# Patient Record
Sex: Female | Born: 1970 | Race: White | Hispanic: No | Marital: Married | Smoking: Current every day smoker
Health system: Southern US, Community
[De-identification: ages and names within clinical notes are randomized; demographics above are authoritative.]

## PROBLEM LIST (undated history)

## (undated) DIAGNOSIS — F32A Depression, unspecified: Secondary | ICD-10-CM

## (undated) DIAGNOSIS — F419 Anxiety disorder, unspecified: Secondary | ICD-10-CM

## (undated) DIAGNOSIS — F329 Major depressive disorder, single episode, unspecified: Secondary | ICD-10-CM

## (undated) HISTORY — PX: NO PAST SURGERIES: SHX2092

---

## 2000-07-24 ENCOUNTER — Encounter: Payer: Self-pay | Admitting: Orthopedic Surgery

## 2000-07-24 ENCOUNTER — Encounter: Admission: RE | Admit: 2000-07-24 | Discharge: 2000-07-24 | Payer: Self-pay | Admitting: Orthopedic Surgery

## 2000-08-14 ENCOUNTER — Encounter: Admission: RE | Admit: 2000-08-14 | Discharge: 2000-08-14 | Payer: Self-pay | Admitting: Orthopedic Surgery

## 2000-08-14 ENCOUNTER — Encounter: Payer: Self-pay | Admitting: Orthopedic Surgery

## 2000-12-12 ENCOUNTER — Encounter: Admission: RE | Admit: 2000-12-12 | Discharge: 2001-03-12 | Payer: Self-pay | Admitting: Anesthesiology

## 2001-04-10 ENCOUNTER — Encounter: Admission: RE | Admit: 2001-04-10 | Discharge: 2001-07-09 | Payer: Self-pay

## 2001-07-03 ENCOUNTER — Other Ambulatory Visit: Admission: RE | Admit: 2001-07-03 | Discharge: 2001-07-03 | Payer: Self-pay | Admitting: *Deleted

## 2002-08-23 ENCOUNTER — Encounter: Admission: RE | Admit: 2002-08-23 | Discharge: 2002-11-21 | Payer: Self-pay

## 2004-07-18 ENCOUNTER — Ambulatory Visit: Payer: Self-pay | Admitting: Cardiovascular Disease

## 2016-06-26 ENCOUNTER — Inpatient Hospital Stay (HOSPITAL_COMMUNITY)
Admission: EM | Admit: 2016-06-26 | Discharge: 2016-06-29 | DRG: 641 | Disposition: A | Discharge status: Home / Self Care | Payer: Medicaid Other | Attending: Internal Medicine | Admitting: Internal Medicine

## 2016-06-26 ENCOUNTER — Encounter: Payer: Self-pay | Admitting: Emergency Medicine

## 2016-06-26 ENCOUNTER — Observation Stay (HOSPITAL_COMMUNITY): Payer: Self-pay

## 2016-06-26 DIAGNOSIS — Z833 Family history of diabetes mellitus: Secondary | ICD-10-CM

## 2016-06-26 DIAGNOSIS — F1010 Alcohol abuse, uncomplicated: Secondary | ICD-10-CM | POA: Diagnosis present

## 2016-06-26 DIAGNOSIS — Z807 Family history of other malignant neoplasms of lymphoid, hematopoietic and related tissues: Secondary | ICD-10-CM

## 2016-06-26 DIAGNOSIS — M545 Low back pain: Secondary | ICD-10-CM | POA: Diagnosis present

## 2016-06-26 DIAGNOSIS — F10229 Alcohol dependence with intoxication, unspecified: Secondary | ICD-10-CM | POA: Diagnosis present

## 2016-06-26 DIAGNOSIS — R9431 Abnormal electrocardiogram [ECG] [EKG]: Secondary | ICD-10-CM | POA: Diagnosis present

## 2016-06-26 DIAGNOSIS — Z79899 Other long term (current) drug therapy: Secondary | ICD-10-CM

## 2016-06-26 DIAGNOSIS — F10939 Alcohol use, unspecified with withdrawal, unspecified: Secondary | ICD-10-CM | POA: Diagnosis present

## 2016-06-26 DIAGNOSIS — Z818 Family history of other mental and behavioral disorders: Secondary | ICD-10-CM

## 2016-06-26 DIAGNOSIS — F10239 Alcohol dependence with withdrawal, unspecified: Secondary | ICD-10-CM | POA: Diagnosis present

## 2016-06-26 DIAGNOSIS — I10 Essential (primary) hypertension: Secondary | ICD-10-CM | POA: Diagnosis present

## 2016-06-26 DIAGNOSIS — Z8249 Family history of ischemic heart disease and other diseases of the circulatory system: Secondary | ICD-10-CM

## 2016-06-26 DIAGNOSIS — D649 Anemia, unspecified: Secondary | ICD-10-CM | POA: Diagnosis present

## 2016-06-26 DIAGNOSIS — Z9049 Acquired absence of other specified parts of digestive tract: Secondary | ICD-10-CM

## 2016-06-26 DIAGNOSIS — F419 Anxiety disorder, unspecified: Secondary | ICD-10-CM | POA: Diagnosis present

## 2016-06-26 DIAGNOSIS — Z9114 Patient's other noncompliance with medication regimen: Secondary | ICD-10-CM

## 2016-06-26 DIAGNOSIS — R55 Syncope and collapse: Secondary | ICD-10-CM | POA: Diagnosis present

## 2016-06-26 DIAGNOSIS — E876 Hypokalemia: Principal | ICD-10-CM

## 2016-06-26 DIAGNOSIS — F17200 Nicotine dependence, unspecified, uncomplicated: Secondary | ICD-10-CM | POA: Diagnosis present

## 2016-06-26 DIAGNOSIS — G8929 Other chronic pain: Secondary | ICD-10-CM | POA: Diagnosis present

## 2016-06-26 LAB — CBC WITH DIFFERENTIAL/PLATELET
Basophils Absolute: 0 10*3/uL (ref 0.0–0.1)
Basophils Relative: 0 %
Eosinophils Absolute: 0.1 10*3/uL (ref 0.0–0.7)
Eosinophils Relative: 2 %
HCT: 32.9 % — ABNORMAL LOW (ref 36.0–46.0)
Hemoglobin: 11.1 g/dL — ABNORMAL LOW (ref 12.0–15.0)
Lymphocytes Relative: 58 %
Lymphs Abs: 3.3 10*3/uL (ref 0.7–4.0)
MCH: 33.4 pg (ref 26.0–34.0)
MCHC: 33.7 g/dL (ref 30.0–36.0)
MCV: 99.1 fL (ref 78.0–100.0)
Monocytes Absolute: 0.6 10*3/uL (ref 0.1–1.0)
Monocytes Relative: 10 %
Neutro Abs: 1.7 10*3/uL (ref 1.7–7.7)
Neutrophils Relative %: 30 %
Platelets: 220 10*3/uL (ref 150–400)
RBC: 3.32 MIL/uL — ABNORMAL LOW (ref 3.87–5.11)
RDW: 19.4 % — ABNORMAL HIGH (ref 11.5–15.5)
WBC: 5.7 10*3/uL (ref 4.0–10.5)

## 2016-06-26 LAB — COMPREHENSIVE METABOLIC PANEL
ALT: 53 U/L (ref 14–54)
AST: 50 U/L — ABNORMAL HIGH (ref 15–41)
Albumin: 3.1 g/dL — ABNORMAL LOW (ref 3.5–5.0)
Alkaline Phosphatase: 113 U/L (ref 38–126)
Anion gap: 14 (ref 5–15)
BUN: <5 mg/dL — ABNORMAL LOW (ref 6–20)
CO2: 29 mmol/L (ref 22–32)
Calcium: 9 mg/dL (ref 8.9–10.3)
Chloride: 97 mmol/L — ABNORMAL LOW (ref 101–111)
Creatinine, Ser: 0.56 mg/dL (ref 0.44–1.00)
GFR calc Af Amer: >60 mL/min (ref >60)
GFR calc non Af Amer: >60 mL/min (ref >60)
Glucose, Bld: 92 mg/dL (ref 65–99)
Potassium: 2.1 mmol/L — CL (ref 3.5–5.1)
Sodium: 140 mmol/L (ref 135–145)
Total Bilirubin: 0.3 mg/dL (ref 0.3–1.2)
Total Protein: 6.6 g/dL (ref 6.5–8.1)

## 2016-06-26 LAB — TROPONIN I

## 2016-06-26 LAB — PHOSPHORUS: PHOSPHORUS: 2.3 mg/dL — AB (ref 2.5–4.6)

## 2016-06-26 LAB — MAGNESIUM: MAGNESIUM: 2 mg/dL (ref 1.7–2.4)

## 2016-06-26 MED ORDER — MAGNESIUM OXIDE 400 (241.3 MG) MG PO TABS
800.0000 mg | ORAL_TABLET | Freq: Once | ORAL | Status: AC
  Administered 2016-06-26: 800 mg via ORAL
  Filled 2016-06-26 (×2): qty 2

## 2016-06-26 MED ORDER — SODIUM CHLORIDE 0.9% FLUSH
3.0000 mL | Freq: Two times a day (BID) | INTRAVENOUS | Status: DC
  Administered 2016-06-27 – 2016-06-28 (×3): 3 mL via INTRAVENOUS

## 2016-06-26 MED ORDER — NAPHAZOLINE-PHENIRAMINE 0.025-0.3 % OP SOLN
1.0000 [drp] | Freq: Every day | OPHTHALMIC | Status: DC | PRN
  Filled 2016-06-26: qty 5

## 2016-06-26 MED ORDER — KETOROLAC TROMETHAMINE 30 MG/ML IJ SOLN
30.0000 mg | Freq: Once | INTRAMUSCULAR | Status: AC
  Administered 2016-06-26: 30 mg via INTRAVENOUS
  Filled 2016-06-26: qty 1

## 2016-06-26 MED ORDER — VITAMIN B-1 100 MG PO TABS
100.0000 mg | ORAL_TABLET | Freq: Every day | ORAL | Status: DC
  Administered 2016-06-26 – 2016-06-29 (×4): 100 mg via ORAL
  Filled 2016-06-26 (×4): qty 1

## 2016-06-26 MED ORDER — LORAZEPAM 0.5 MG PO TABS
0.5000 mg | ORAL_TABLET | Freq: Once | ORAL | Status: AC
  Administered 2016-06-26: 0.5 mg via ORAL
  Filled 2016-06-26: qty 1

## 2016-06-26 MED ORDER — KETOROLAC TROMETHAMINE 60 MG/2ML IM SOLN: 30.0000 mg | Freq: Once | INTRAMUSCULAR | Status: DC

## 2016-06-26 MED ORDER — FOLIC ACID 1 MG PO TABS
1.0000 mg | ORAL_TABLET | Freq: Every day | ORAL | Status: DC
  Administered 2016-06-26 – 2016-06-29 (×4): 1 mg via ORAL
  Filled 2016-06-26 (×4): qty 1

## 2016-06-26 MED ORDER — PNEUMOCOCCAL VAC POLYVALENT 25 MCG/0.5ML IJ INJ: 0.5000 mL | INJECTION | INTRAMUSCULAR | Status: DC | PRN

## 2016-06-26 MED ORDER — ADULT MULTIVITAMIN W/MINERALS CH
1.0000 | ORAL_TABLET | Freq: Every day | ORAL | Status: DC
  Administered 2016-06-26 – 2016-06-29 (×4): 1 via ORAL
  Filled 2016-06-26 (×4): qty 1

## 2016-06-26 MED ORDER — LORAZEPAM 2 MG/ML IJ SOLN
1.0000 mg | Freq: Four times a day (QID) | INTRAMUSCULAR | Status: DC | PRN
  Administered 2016-06-26: 1 mg via INTRAVENOUS
  Filled 2016-06-26: qty 1

## 2016-06-26 MED ORDER — BISACODYL 10 MG RE SUPP: 10.0000 mg | Freq: Every day | RECTAL | Status: DC | PRN

## 2016-06-26 MED ORDER — SODIUM CHLORIDE 0.9 % IV SOLN
Freq: Once | INTRAVENOUS | Status: AC
  Administered 2016-06-26: 22:00:00 via INTRAVENOUS
  Filled 2016-06-26 (×2): qty 1000

## 2016-06-26 MED ORDER — CHLORDIAZEPOXIDE HCL 25 MG PO CAPS
100.0000 mg | ORAL_CAPSULE | Freq: Once | ORAL | Status: AC
  Administered 2016-06-26: 100 mg via ORAL
  Filled 2016-06-26: qty 4

## 2016-06-26 MED ORDER — LORAZEPAM 1 MG PO TABS: 0.0000 mg | ORAL_TABLET | Freq: Two times a day (BID) | ORAL | Status: DC

## 2016-06-26 MED ORDER — POTASSIUM CHLORIDE CRYS ER 20 MEQ PO TBCR
80.0000 meq | EXTENDED_RELEASE_TABLET | Freq: Once | ORAL | Status: AC
Start: 2016-06-26 — End: 2016-06-26
  Administered 2016-06-26: 80 meq via ORAL
  Filled 2016-06-26: qty 4

## 2016-06-26 MED ORDER — THIAMINE HCL 100 MG/ML IJ SOLN
100.0000 mg | Freq: Every day | INTRAMUSCULAR | Status: DC
  Administered 2016-06-28: 100 mg via INTRAVENOUS
  Filled 2016-06-26: qty 2

## 2016-06-26 MED ORDER — HYDROCODONE-ACETAMINOPHEN 10-325 MG PO TABS
1.0000 | ORAL_TABLET | Freq: Four times a day (QID) | ORAL | Status: DC | PRN
Start: 2016-06-26 — End: 2016-06-29
  Administered 2016-06-26 – 2016-06-29 (×10): 1 via ORAL
  Filled 2016-06-26 (×11): qty 1

## 2016-06-26 MED ORDER — LORAZEPAM 1 MG PO TABS
0.0000 mg | ORAL_TABLET | Freq: Four times a day (QID) | ORAL | Status: DC
  Administered 2016-06-27: 2 mg via ORAL
  Filled 2016-06-26: qty 4

## 2016-06-26 MED ORDER — LORAZEPAM 1 MG PO TABS: 1.0000 mg | ORAL_TABLET | Freq: Four times a day (QID) | ORAL | Status: DC | PRN

## 2016-06-26 MED ORDER — POTASSIUM PHOSPHATES 15 MMOLE/5ML IV SOLN
20.0000 meq | Freq: Once | INTRAVENOUS | Status: AC
  Administered 2016-06-27: 20 meq via INTRAVENOUS
  Filled 2016-06-26: qty 4.55

## 2016-06-26 MED ORDER — ACETAMINOPHEN 500 MG PO TABS
1000.0000 mg | ORAL_TABLET | Freq: Once | ORAL | Status: AC
  Administered 2016-06-26: 1000 mg via ORAL
  Filled 2016-06-26: qty 2

## 2016-06-26 MED ORDER — INFLUENZA VAC SPLIT QUAD 0.5 ML IM SUSY: 0.5000 mL | PREFILLED_SYRINGE | INTRAMUSCULAR | Status: DC | PRN

## 2016-06-26 MED ORDER — TRAMADOL HCL 50 MG PO TABS
50.0000 mg | ORAL_TABLET | Freq: Four times a day (QID) | ORAL | Status: DC | PRN
  Administered 2016-06-26 – 2016-06-29 (×10): 50 mg via ORAL
  Filled 2016-06-26 (×11): qty 1

## 2016-06-26 MED ORDER — POLYETHYLENE GLYCOL 3350 17 G PO PACK
17.0000 g | PACK | Freq: Every day | ORAL | Status: DC | PRN
Start: 2016-06-26 — End: 2016-06-29

## 2016-06-26 NOTE — ED Triage Notes (Signed)
[facility] with complaint of acute on chronic back pain onset today. Pt moving all extremities. Denies trauma/fall. VSS. Has severe anxiety, per patient, takes xanax and has "had six airplane bottles today."

## 2016-06-26 NOTE — ED Notes (Signed)
Notified pharmacy of need for IV potassium and magnesium.

## 2016-06-26 NOTE — ED Provider Notes (Signed)
[facility]-EMERGENCY DEPT Provider Note   CSN: [id number] Arrival date & time: 06/26/16  1520     History   Chief Complaint Chief Complaint  Patient presents with  . Back Pain    HPI Cathy McalpineStephanie O. Cathy Barker is a 45 y.o. female.  45 yo F with a chief complaint of low back pain. This been going on for at least 10 years is been fluctuating in its course. Patient thinks he got worse about for 5 days ago when she bent over a trash can. She has been sitting in her house with her feet elevated for the past time. Denies loss of bowel or bladder denies loss of perirectal sensation. Denies weakness to either leg denies numbness. Patient also was drinking heavily today as she is depressed over some illnesses in her family. Denies suicidal or homicidal ideation. She has seen her family physician multiple times for the back pain is upset that she has not been referred to a spinal surgeon at this point. She said she has had multiple MRIs as well.   The history is provided by the patient.  Back Pain   This is a new problem. The current episode started more than 1 week ago. The problem occurs constantly. The problem has not changed since onset.The pain is associated with no known injury. The pain is present in the lumbar spine. The pain is at a severity of 10/10. The pain is severe. The pain is the same all the time. Pertinent negatives include no chest pain, no fever, no headaches and no dysuria. She has tried nothing for the symptoms. The treatment provided no relief.    History reviewed. No pertinent past medical history.  Patient Active Problem List   Diagnosis Date Noted  . Hypokalemia 06/26/2016  . Syncope and collapse 06/26/2016  . Prolonged QT interval 06/26/2016  . Anxiety 06/26/2016  . Alcohol abuse 06/26/2016    History reviewed. No pertinent surgical history.  OB History    No data available       Home Medications    Prior to Admission medications   Medication Sig Start Date End  Date Taking? Authorizing Provider  ALPRAZolam Prudy Feeler(XANAX) 1 MG tablet Take 1 mg by mouth 3 (three) times daily.   Yes Historical Provider, MD  furosemide (LASIX) 40 MG tablet Take 40 mg by mouth 2 (two) times daily.   Yes Historical Provider, MD  HYDROcodone-acetaminophen (NORCO) 10-325 MG tablet Take 1 tablet by mouth every 6 (six) hours as needed for moderate pain.   Yes Historical Provider, MD  naphazoline-pheniramine (NAPHCON-A) 0.025-0.3 % ophthalmic solution Place 1 drop into both eyes daily as needed for irritation.   Yes Historical Provider, MD  traMADol (ULTRAM) 50 MG tablet Take 50 mg by mouth every 6 (six) hours as needed for moderate pain.   Yes Historical Provider, MD    Family History Family History  Problem Relation Age of Onset  . Diabetes Mother   . Diabetes Father   . Lymphoma Father   . CAD Father   . Diabetes Other   . Cancer Other   . CAD Other     Social History Social History  Substance Use Topics  . Smoking status: Current Every Day Smoker  . Smokeless tobacco: Never Used  . Alcohol use Yes     Comment: she does binge drinking     Allergies   Patient has no known allergies.   Review of Systems Review of Systems  Constitutional: Negative for chills and  fever.  HENT: Negative for congestion and rhinorrhea.   Eyes: Negative for redness and visual disturbance.  Respiratory: Negative for shortness of breath and wheezing.   Cardiovascular: Negative for chest pain and palpitations.  Gastrointestinal: Negative for nausea and vomiting.  Genitourinary: Negative for dysuria and urgency.  Musculoskeletal: Positive for back pain. Negative for arthralgias and myalgias.  Skin: Negative for pallor and wound.  Neurological: Positive for syncope. Negative for dizziness and headaches.     Physical Exam Updated Vital Signs BP 126/75 (BP Location: Right Arm)   Pulse (!) 101   Temp 98.2 F (36.8 C) (Oral)   Resp (!) 21   Ht 5\' 7"  (1.702 m)   Wt 196 lb 1.6 oz (89  kg)   LMP 06/20/2016 (Exact Date)   SpO2 99%   BMI 30.71 kg/m   Physical Exam  Constitutional: She is oriented to person, place, and time. She appears well-developed and well-nourished. No distress.  HENT:  Head: Normocephalic and atraumatic.  Eyes: EOM are normal. Pupils are equal, round, and reactive to light.  Neck: Normal range of motion. Neck supple.  Cardiovascular: Normal rate and regular rhythm.  Exam reveals no gallop and no friction rub.   No murmur heard. Pulmonary/Chest: Effort normal. She has no wheezes. She has no rales.  Abdominal: Soft. She exhibits no distension and no mass. There is no tenderness. There is no guarding.  Musculoskeletal: She exhibits tenderness (complains of tenderness to the lower L spine to the coccyx.  ). She exhibits no edema.  PMS intact distally, reflexes normal.  Strength equal and appropriate.   Neurological: She is alert and oriented to person, place, and time.  Skin: Skin is warm and dry. She is not diaphoretic.  Psychiatric: She has a normal mood and affect. Her behavior is normal.  Nursing note and vitals reviewed.    ED Treatments / Results  Labs (all labs ordered are listed, but only abnormal results are displayed) Labs Reviewed  CBC WITH DIFFERENTIAL/PLATELET - Abnormal; Notable for the following:       Result Value   RBC 3.32 (*)    Hemoglobin 11.1 (*)    HCT 32.9 (*)    RDW 19.4 (*)    All other components within normal limits  COMPREHENSIVE METABOLIC PANEL - Abnormal; Notable for the following:    Potassium 2.1 (*)    Chloride 97 (*)    BUN <5 (*)    Albumin 3.1 (*)    AST 50 (*)    All other components within normal limits  PHOSPHORUS - Abnormal; Notable for the following:    Phosphorus 2.3 (*)    All other components within normal limits  MAGNESIUM  RAPID URINE DRUG SCREEN, HOSP PERFORMED  TROPONIN I  TROPONIN I  TROPONIN I  PROTIME-INR  HEMOGLOBIN A1C    EKG  EKG Interpretation  Date/Time:  Wednesday  June 26 2016 16:30:24 EST Ventricular Rate:  101 PR Interval:    QRS Duration: 96 QT Interval:  379 QTC Calculation: 492 R Axis:   64 Text Interpretation:  Sinus tachycardia Borderline repol abnrm, anterior leads Borderline prolonged QT interval No old tracing to compare Confirmed by Hettie Roselli MD, DANIEL [phone]([id number]) on 06/26/2016 5:17:23 PM       Radiology No results found.  Procedures Procedures (including critical care time)  Medications Ordered in ED Medications  Influenza vac split quadrivalent PF (FLUARIX) injection 0.5 mL (not administered)  pneumococcal 23 valent vaccine (PNU-IMMUNE) injection 0.5 mL (not administered)  HYDROcodone-acetaminophen (NORCO) 10-325 MG per tablet 1 tablet (1 tablet Oral Given 06/26/16 2233)  naphazoline-pheniramine (NAPHCON-A) 0.025-0.3 % ophthalmic solution 1 drop (not administered)  traMADol (ULTRAM) tablet 50 mg (not administered)  LORazepam (ATIVAN) tablet 1 mg ( Oral See Alternative 06/26/16 2231)    Or  LORazepam (ATIVAN) injection 1 mg (1 mg Intravenous Given 06/26/16 2231)  thiamine (VITAMIN B-1) tablet 100 mg (100 mg Oral Given 06/26/16 2243)    Or  thiamine (B-1) injection 100 mg ( Intravenous See Alternative 06/26/16 2243)  folic acid (FOLVITE) tablet 1 mg (1 mg Oral Given 06/26/16 2243)  multivitamin with minerals tablet 1 tablet (1 tablet Oral Given 06/26/16 2243)  sodium chloride flush (NS) 0.9 % injection 3 mL (not administered)  LORazepam (ATIVAN) tablet 0-4 mg (not administered)    Followed by  LORazepam (ATIVAN) tablet 0-4 mg (not administered)  polyethylene glycol (MIRALAX / GLYCOLAX) packet 17 g (not administered)  bisacodyl (DULCOLAX) suppository 10 mg (not administered)  potassium phosphate 20 mEq in dextrose 5 % 250 mL infusion (not administered)  acetaminophen (TYLENOL) tablet 1,000 mg (1,000 mg Oral Given 06/26/16 1638)  ketorolac (TORADOL) 30 MG/ML injection 30 mg (30 mg Intravenous Given 06/26/16 1638)  potassium  chloride SA (K-DUR,KLOR-CON) CR tablet 80 mEq (80 mEq Oral Given 06/26/16 1737)  magnesium oxide (MAG-OX) tablet 800 mg (800 mg Oral Given 06/26/16 2233)  chlordiazePOXIDE (LIBRIUM) capsule 100 mg (100 mg Oral Given 06/26/16 1735)  sodium chloride 0.9 % 1,000 mL with potassium chloride 80 mEq infusion ( Intravenous Given 06/26/16 2157)  LORazepam (ATIVAN) tablet 0.5 mg (0.5 mg Oral Given 06/26/16 2244)     Initial Impression / Assessment and Plan / ED Course  I have reviewed the triage vital signs and the nursing notes.  Pertinent labs & imaging results that were available during my care of the patient were reviewed by me and considered in my medical decision making (see chart for details).  Clinical Course     45 yo F With a chief complaint of low back pain. No red flags. No recent injury. See no reason for imaging at this time. Patient arrived intoxicated upon my evaluation patient is able to have a conversation. I see no reason for further stay in the ED. As patient was mildly intoxicated do not feel that narcotics would benefit her.  Family arrived and they're more concerned about her syncopal event. Patient also has not been taking her potassium with her Lasix. Potassium level drawn here is 2.1. Her EKG shows slightly prolonged QT. With hypokalemia and EKG changes will place in the hospital for potassium repletion.   CRITICAL CARE Performed by: Rae Roam   Total critical care time: 45 minutes  Critical care time was exclusive of separately billable procedures and treating other patients.  Critical care was necessary to treat or prevent imminent or life-threatening deterioration.  Critical care was time spent personally by me on the following activities: development of treatment plan with patient and/or surrogate as well as nursing, discussions with consultants, evaluation of patient's response to treatment, examination of patient, obtaining history from patient or  surrogate, ordering and performing treatments and interventions, ordering and review of laboratory studies, ordering and review of radiographic studies, pulse oximetry and re-evaluation of patient's condition.   The patients results and plan were reviewed and discussed.   Any x-rays performed were independently reviewed by myself.   Differential diagnosis were considered with the presenting HPI.  Medications  Influenza vac split quadrivalent  PF (FLUARIX) injection 0.5 mL (not administered)  pneumococcal 23 valent vaccine (PNU-IMMUNE) injection 0.5 mL (not administered)  HYDROcodone-acetaminophen (NORCO) 10-325 MG per tablet 1 tablet (1 tablet Oral Given 06/26/16 2233)  naphazoline-pheniramine (NAPHCON-A) 0.025-0.3 % ophthalmic solution 1 drop (not administered)  traMADol (ULTRAM) tablet 50 mg (not administered)  LORazepam (ATIVAN) tablet 1 mg ( Oral See Alternative 06/26/16 2231)    Or  LORazepam (ATIVAN) injection 1 mg (1 mg Intravenous Given 06/26/16 2231)  thiamine (VITAMIN B-1) tablet 100 mg (100 mg Oral Given 06/26/16 2243)    Or  thiamine (B-1) injection 100 mg ( Intravenous See Alternative 06/26/16 2243)  folic acid (FOLVITE) tablet 1 mg (1 mg Oral Given 06/26/16 2243)  multivitamin with minerals tablet 1 tablet (1 tablet Oral Given 06/26/16 2243)  sodium chloride flush (NS) 0.9 % injection 3 mL (not administered)  LORazepam (ATIVAN) tablet 0-4 mg (not administered)    Followed by  LORazepam (ATIVAN) tablet 0-4 mg (not administered)  polyethylene glycol (MIRALAX / GLYCOLAX) packet 17 g (not administered)  bisacodyl (DULCOLAX) suppository 10 mg (not administered)  potassium phosphate 20 mEq in dextrose 5 % 250 mL infusion (not administered)  acetaminophen (TYLENOL) tablet 1,000 mg (1,000 mg Oral Given 06/26/16 1638)  ketorolac (TORADOL) 30 MG/ML injection 30 mg (30 mg Intravenous Given 06/26/16 1638)  potassium chloride SA (K-DUR,KLOR-CON) CR tablet 80 mEq (80 mEq Oral Given  06/26/16 1737)  magnesium oxide (MAG-OX) tablet 800 mg (800 mg Oral Given 06/26/16 2233)  chlordiazePOXIDE (LIBRIUM) capsule 100 mg (100 mg Oral Given 06/26/16 1735)  sodium chloride 0.9 % 1,000 mL with potassium chloride 80 mEq infusion ( Intravenous Given 06/26/16 2157)  LORazepam (ATIVAN) tablet 0.5 mg (0.5 mg Oral Given 06/26/16 2244)    Vitals:   06/26/16 1830 06/26/16 1845 06/26/16 1930 06/26/16 2133  BP: 111/74 116/82 113/77 126/75  Pulse: 97  104 (!) 101  Resp: 12 18 13  (!) 21  Temp:    98.2 F (36.8 C)  TempSrc:    Oral  SpO2: 98%  98% 99%  Weight:    196 lb 1.6 oz (89 kg)  Height:    5\' 7"  (1.702 m)    Final diagnoses:  Syncope, unspecified syncope type  Hypokalemia    Admission/ observation were discussed with the admitting physician, patient and/or family and they are comfortable with the plan.   Final Clinical Impressions(s) / ED Diagnoses   Final diagnoses:  Syncope, unspecified syncope type  Hypokalemia    New Prescriptions Current Discharge Medication List       Melene Plan, DO 06/26/16 2259

## 2016-06-26 NOTE — ED Notes (Signed)
Admitting MD to assess pt. Admitting MD informed that pt requesting pain medication and feels like she might withdrawal from her medication.

## 2016-06-26 NOTE — ED Notes (Addendum)
Attempted report x 2 

## 2016-06-26 NOTE — ED Provider Notes (Signed)
MSE was initiated and I personally evaluated the patient and placed orders (if any) at  3:47 PM on June 26, 2016. Patient here with worsening chronic back pain. Had copious amounts of alcohol prior to arrival. On exam she is protecting her airway. No focal neurological deficits on exam. Workup started The patient appears stable so that the remainder of the MSE may be completed by another provider.   Lorre NickAnthony Srihaan Mastrangelo, MD 06/26/16 [phone]

## 2016-06-26 NOTE — ED Notes (Signed)
Attempted report x1. 
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
## 2016-06-27 ENCOUNTER — Observation Stay (HOSPITAL_COMMUNITY): Payer: Self-pay

## 2016-06-27 DIAGNOSIS — E876 Hypokalemia: Principal | ICD-10-CM

## 2016-06-27 DIAGNOSIS — R9431 Abnormal electrocardiogram [ECG] [EKG]: Secondary | ICD-10-CM

## 2016-06-27 DIAGNOSIS — F1010 Alcohol abuse, uncomplicated: Secondary | ICD-10-CM

## 2016-06-27 DIAGNOSIS — R55 Syncope and collapse: Secondary | ICD-10-CM

## 2016-06-27 LAB — TROPONIN I
Troponin I: <0.03 ng/mL (ref <0.03)
Troponin I: <0.03 ng/mL (ref <0.03)

## 2016-06-27 LAB — COMPREHENSIVE METABOLIC PANEL
ALBUMIN: 3 g/dL — AB (ref 3.5–5.0)
ALT: 45 U/L (ref 14–54)
AST: 44 U/L — AB (ref 15–41)
Alkaline Phosphatase: 100 U/L (ref 38–126)
Anion gap: 9 (ref 5–15)
BILIRUBIN TOTAL: 0.7 mg/dL (ref 0.3–1.2)
BUN: 6 mg/dL (ref 6–20)
CO2: 28 mmol/L (ref 22–32)
Calcium: 8.3 mg/dL — ABNORMAL LOW (ref 8.9–10.3)
Chloride: 101 mmol/L (ref 101–111)
Creatinine, Ser: 0.71 mg/dL (ref 0.44–1.00)
GFR calc Af Amer: >60 mL/min (ref >60)
GFR calc non Af Amer: >60 mL/min (ref >60)
GLUCOSE: 92 mg/dL (ref 65–99)
POTASSIUM: 3.4 mmol/L — AB (ref 3.5–5.1)
Sodium: 138 mmol/L (ref 135–145)
TOTAL PROTEIN: 5.8 g/dL — AB (ref 6.5–8.1)

## 2016-06-27 LAB — GLUCOSE, CAPILLARY: Glucose-Capillary: 106 mg/dL — ABNORMAL HIGH (ref 65–99)

## 2016-06-27 LAB — RAPID URINE DRUG SCREEN, HOSP PERFORMED
AMPHETAMINES: NOT DETECTED
BENZODIAZEPINES: POSITIVE — AB
Barbiturates: NOT DETECTED
COCAINE: NOT DETECTED
OPIATES: POSITIVE — AB
TETRAHYDROCANNABINOL: NOT DETECTED

## 2016-06-27 LAB — CBC
HEMATOCRIT: 34 % — AB (ref 36.0–46.0)
Hemoglobin: 11.4 g/dL — ABNORMAL LOW (ref 12.0–15.0)
MCH: 34 pg (ref 26.0–34.0)
MCHC: 33.5 g/dL (ref 30.0–36.0)
MCV: 101.5 fL — AB (ref 78.0–100.0)
Platelets: 212 10*3/uL (ref 150–400)
RBC: 3.35 MIL/uL — ABNORMAL LOW (ref 3.87–5.11)
RDW: 19.6 % — AB (ref 11.5–15.5)
WBC: 3.7 10*3/uL — ABNORMAL LOW (ref 4.0–10.5)

## 2016-06-27 LAB — PROTIME-INR
INR: 1.02
Prothrombin Time: 13.4 seconds (ref 11.4–15.2)

## 2016-06-27 MED ORDER — LORAZEPAM 2 MG/ML IJ SOLN
0.5000 mg | Freq: Once | INTRAMUSCULAR | Status: AC
  Administered 2016-06-27: 0.5 mg via INTRAVENOUS
  Filled 2016-06-27: qty 1

## 2016-06-27 MED ORDER — POTASSIUM CHLORIDE CRYS ER 20 MEQ PO TBCR
40.0000 meq | EXTENDED_RELEASE_TABLET | Freq: Once | ORAL | Status: AC
  Administered 2016-06-27: 40 meq via ORAL
  Filled 2016-06-27: qty 2

## 2016-06-27 MED ORDER — LORAZEPAM 2 MG/ML IJ SOLN: 0.0000 mg | Freq: Two times a day (BID) | INTRAMUSCULAR | Status: DC

## 2016-06-27 MED ORDER — LORAZEPAM 2 MG/ML IJ SOLN
0.0000 mg | Freq: Four times a day (QID) | INTRAMUSCULAR | Status: DC
  Administered 2016-06-27 (×4): 2 mg via INTRAVENOUS
  Administered 2016-06-28: 4 mg via INTRAVENOUS
  Administered 2016-06-28: 2 mg via INTRAVENOUS
  Filled 2016-06-27 (×2): qty 2
  Filled 2016-06-27 (×5): qty 1

## 2016-06-27 MED ORDER — LORAZEPAM 2 MG/ML IJ SOLN
1.0000 mg | Freq: Four times a day (QID) | INTRAMUSCULAR | Status: DC | PRN
  Administered 2016-06-27 – 2016-06-29 (×3): 1 mg via INTRAVENOUS
  Filled 2016-06-27 (×5): qty 1

## 2016-06-27 MED ORDER — KETOROLAC TROMETHAMINE 30 MG/ML IJ SOLN
30.0000 mg | Freq: Once | INTRAMUSCULAR | Status: AC
  Administered 2016-06-27: 30 mg via INTRAVENOUS
  Filled 2016-06-27: qty 1

## 2016-06-27 MED ORDER — LORAZEPAM 1 MG PO TABS
1.0000 mg | ORAL_TABLET | Freq: Four times a day (QID) | ORAL | Status: DC | PRN
  Administered 2016-06-28 – 2016-06-29 (×2): 1 mg via ORAL
  Filled 2016-06-27 (×2): qty 1

## 2016-06-27 NOTE — Progress Notes (Signed)
[facility] PROGRESS NOTE  Marland McalpineStephanie O. Michelli YQM:[id number] DOB: 1971-01-07 DOA: 06/26/2016  PCP: Pcp Not In System  Brief History/Interval Summary: 45 year old Caucasian female with a past medical history of chronic back pain, anxiety disorder, hypertension, alcohol abuse, and a questionable history of CHF, presented with worsening acute on chronic back pain. Patient apparently ordered, so he had a syncopal episode at home. Patient was found to be profoundly hypokalemic. She was hospitalized for further management.  Reason for Visit: Syncope. Alcohol withdrawal  Consultants: None  Procedures: Transthoracic echocardiogram is pending  Antibiotics: None  Subjective/Interval History: Patient feels tremulous this morning. Continues to have significant pain in her lower back. Denies any falls or injuries. She mentions that her pain got exacerbated a few days ago when she had to bend down to pick up something from the ground. She is able to move her legs. Denies any urinary or stool incontinence.  ROS: Denies any nausea or vomiting  Objective:  Vital Signs  Vitals:   06/26/16 1930 06/26/16 2133 06/27/16 0449 06/27/16 0923  BP: 113/77 126/75 106/67 106/73  Pulse: 104 (!) 101 100 93  Resp: 13 (!) 21 19 18   Temp:  98.2 F (36.8 C) 98.2 F (36.8 C) 99.3 F (37.4 C)  TempSrc:  Oral Oral Oral  SpO2: 98% 99% 98% 97%  Weight:  89 kg (196 lb 1.6 oz)    Height:  5\' 7"  (1.702 m)      Intake/Output Summary (Last 24 hours) at 06/27/16 1324 Last data filed at 06/27/16 1000  Gross per 24 hour  Intake           974.55 ml  Output              175 ml  Net           799.55 ml   Filed Weights   06/26/16 2133  Weight: 89 kg (196 lb 1.6 oz)    General appearance: alert, cooperative, appears stated age and no distress Resp: clear to auscultation bilaterally Cardio: regular rate and rhythm, S1, S2 normal, no murmur, click, rub or gallop GI: soft, mildly tender in RUQ.  bowel  sounds normal; no masses,  no organomegaly Extremities: Able to move both her lower extremities without any difficulty. Neurologic: No obvious focal neurological deficits. Tremors noted.  Lab Results:  Data Reviewed: I have personally reviewed following labs and imaging studies  CBC:  Recent Labs Lab 06/26/16 1600 06/27/16 1059  WBC 5.7 3.7*  NEUTROABS 1.7  --   HGB 11.1* 11.4*  HCT 32.9* 34.0*  MCV 99.1 101.5*  PLT 220 212    Basic Metabolic Panel:  Recent Labs Lab 06/26/16 1600 06/26/16 1843 06/27/16 1059  NA 140  --  138  K 2.1*  --  3.4*  CL 97*  --  101  CO2 29  --  28  GLUCOSE 92  --  92  BUN <5*  --  6  CREATININE 0.56  --  0.71  CALCIUM 9.0  --  8.3*  MG  --  2.0  --   PHOS  --  2.3*  --     GFR: Estimated Creatinine Clearance: 100.7 mL/min (by C-G formula based on SCr of 0.71 mg/dL).  Liver Function Tests:  Recent Labs Lab 06/26/16 1600 06/27/16 1059  AST 50* 44*  ALT 53 45  ALKPHOS 113 100  BILITOT 0.3 0.7  PROT 6.6 5.8*  ALBUMIN 3.1* 3.0*    Coagulation Profile:  Recent  Labs Lab 06/27/16 0839  INR 1.02    Cardiac Enzymes:  Recent Labs Lab 06/26/16 2211 06/27/16 0346 06/27/16 0839  TROPONINI <0.03 <0.03 <0.03    CBG:  Recent Labs Lab 06/27/16 0743  GLUCAP 106*     Radiology Studies: X-ray Chest Pa And Lateral  Result Date: 06/26/2016 CLINICAL DATA:  Acute onset of syncope.  Initial encounter. EXAM: CHEST  2 VIEW COMPARISON:  Chest radiograph performed 02/06/2016 FINDINGS: The lungs are well-aerated. Pulmonary vascularity is at the upper limits of normal. There is no evidence of focal opacification, pleural effusion or pneumothorax. The heart is normal in size; the mediastinal contour is within normal limits. No acute osseous abnormalities are seen. IMPRESSION: No acute cardiopulmonary process seen. Electronically Signed   By: Roanna RaiderJeffery  Chang M.D.   On: 06/26/2016 23:45     Medications:  Scheduled: . folic acid  1  mg Oral Daily  . LORazepam  0-4 mg Intravenous Q6H   Followed by  . [START ON 06/29/2016] LORazepam  0-4 mg Intravenous Q12H  . multivitamin with minerals  1 tablet Oral Daily  . potassium chloride  40 mEq Oral Once  . sodium chloride flush  3 mL Intravenous Q12H  . thiamine  100 mg Oral Daily   Or  . thiamine  100 mg Intravenous Daily   Continuous:  HYQ:MVHQIONGEPRN:bisacodyl, HYDROcodone-acetaminophen, Influenza vac split quadrivalent PF, LORazepam **OR** LORazepam, naphazoline-pheniramine, pneumococcal 23 valent vaccine, polyethylene glycol, traMADol  Assessment/Plan:  Active Problems:   Hypokalemia   Syncope and collapse   Prolonged QT interval   Anxiety   Alcohol abuse    Hypokalemia Likely secondary to medication noncompliance and also recent episode of nausea vomiting diarrhea. Patient is on Lasix at home and is supposed to take potassium supplements along with that, but she had not been doing so. Magnesium is normal. Potassium was aggressively repleted. Improved this morning.   Syncope This occurred in a setting of patient being intoxicated but also may have prior history of CHF. EKG showed prolonged QT interval, which could be due to electrolyte imbalances. Echocardiogram is pending.   Prolonged QT interval Replace electrolytes. Continue to monitor on telemetry. Avoid QT prolonging medications. Repeat EKG tomorrow.  Alcohol abuse with alcohol withdrawal syndrome Patient is noted to be tremulous. She is on the CIWA protocol. Has some discomfort in the right upper quadrant of her abdomen. She is status post cholecystectomy. Ultrasound is pending.  Low back pain, acute on chronic Patient has long-standing chronic low back pain. Recently exacerbated over the last few days. Does not have any neurological deficits. Pain Control. PT evaluation.   Anxiety Anxiolytics as needed.   Normocytic anemia. No evidence for overt bleeding. Monitor hemoglobin closely.  DVT Prophylaxis: SCDs     Code Status: Full code  Family Communication: Discussed with the patient  Disposition Plan: Await improvement in withdrawal symptoms. Await echocardiogram. Anticipate she'll be able to go home at the time of discharge.    LOS: 0 days   [facility] Center At Plymoth MeetingKRISHNAN,Garey Alleva  [facility] Pager [phone] 06/27/2016, 1:24 PM  If 7PM-7AM, please contact night-coverage at [email or web address], password [email or web address]

## 2016-06-28 ENCOUNTER — Inpatient Hospital Stay (HOSPITAL_COMMUNITY): Payer: Self-pay

## 2016-06-28 DIAGNOSIS — Z833 Family history of diabetes mellitus: Secondary | ICD-10-CM

## 2016-06-28 DIAGNOSIS — F10232 Alcohol dependence with withdrawal with perceptual disturbance: Secondary | ICD-10-CM

## 2016-06-28 DIAGNOSIS — F332 Major depressive disorder, recurrent severe without psychotic features: Secondary | ICD-10-CM

## 2016-06-28 DIAGNOSIS — R55 Syncope and collapse: Secondary | ICD-10-CM

## 2016-06-28 DIAGNOSIS — F419 Anxiety disorder, unspecified: Secondary | ICD-10-CM

## 2016-06-28 DIAGNOSIS — F4001 Agoraphobia with panic disorder: Secondary | ICD-10-CM

## 2016-06-28 DIAGNOSIS — Z79899 Other long term (current) drug therapy: Secondary | ICD-10-CM

## 2016-06-28 DIAGNOSIS — F10939 Alcohol use, unspecified with withdrawal, unspecified: Secondary | ICD-10-CM | POA: Diagnosis present

## 2016-06-28 DIAGNOSIS — F10230 Alcohol dependence with withdrawal, uncomplicated: Secondary | ICD-10-CM

## 2016-06-28 DIAGNOSIS — F17210 Nicotine dependence, cigarettes, uncomplicated: Secondary | ICD-10-CM

## 2016-06-28 DIAGNOSIS — Z808 Family history of malignant neoplasm of other organs or systems: Secondary | ICD-10-CM

## 2016-06-28 DIAGNOSIS — F10239 Alcohol dependence with withdrawal, unspecified: Secondary | ICD-10-CM | POA: Diagnosis present

## 2016-06-28 LAB — COMPREHENSIVE METABOLIC PANEL
ALBUMIN: 2.7 g/dL — AB (ref 3.5–5.0)
ALK PHOS: 85 U/L (ref 38–126)
ALT: 34 U/L (ref 14–54)
ANION GAP: 7 (ref 5–15)
AST: 33 U/L (ref 15–41)
BILIRUBIN TOTAL: 0.4 mg/dL (ref 0.3–1.2)
BUN: 6 mg/dL (ref 6–20)
CALCIUM: 8.1 mg/dL — AB (ref 8.9–10.3)
CO2: 28 mmol/L (ref 22–32)
CREATININE: 0.66 mg/dL (ref 0.44–1.00)
Chloride: 106 mmol/L (ref 101–111)
GFR calc non Af Amer: >60 mL/min (ref >60)
GLUCOSE: 107 mg/dL — AB (ref 65–99)
Potassium: 3.5 mmol/L (ref 3.5–5.1)
Sodium: 141 mmol/L (ref 135–145)
TOTAL PROTEIN: 5.1 g/dL — AB (ref 6.5–8.1)

## 2016-06-28 LAB — HEMOGLOBIN A1C
Hgb A1c MFr Bld: 5 % (ref 4.8–5.6)
Mean Plasma Glucose: 97 mg/dL

## 2016-06-28 LAB — CBC
HEMATOCRIT: 29.8 % — AB (ref 36.0–46.0)
HEMOGLOBIN: 9.9 g/dL — AB (ref 12.0–15.0)
MCH: 34.3 pg — AB (ref 26.0–34.0)
MCHC: 33.2 g/dL (ref 30.0–36.0)
MCV: 103.1 fL — ABNORMAL HIGH (ref 78.0–100.0)
Platelets: 223 10*3/uL (ref 150–400)
RBC: 2.89 MIL/uL — AB (ref 3.87–5.11)
RDW: 19.9 % — ABNORMAL HIGH (ref 11.5–15.5)
WBC: 3.2 10*3/uL — ABNORMAL LOW (ref 4.0–10.5)

## 2016-06-28 LAB — GLUCOSE, CAPILLARY: GLUCOSE-CAPILLARY: 118 mg/dL — AB (ref 65–99)

## 2016-06-28 LAB — ECHOCARDIOGRAM COMPLETE
Height: 67 in
WEIGHTICAEL: 3156.99 [oz_av]

## 2016-06-28 LAB — MAGNESIUM: Magnesium: 2.1 mg/dL (ref 1.7–2.4)

## 2016-06-28 MED ORDER — LORAZEPAM 2 MG/ML IJ SOLN
2.0000 mg | Freq: Once | INTRAMUSCULAR | Status: AC
  Administered 2016-06-28: 2 mg via INTRAVENOUS

## 2016-06-28 MED ORDER — CHLORDIAZEPOXIDE HCL 5 MG PO CAPS
25.0000 mg | ORAL_CAPSULE | Freq: Four times a day (QID) | ORAL | Status: DC
  Administered 2016-06-28 – 2016-06-29 (×4): 25 mg via ORAL
  Filled 2016-06-28 (×3): qty 5

## 2016-06-28 MED ORDER — TRAZODONE HCL 50 MG PO TABS
50.0000 mg | ORAL_TABLET | Freq: Every day | ORAL | Status: DC
  Administered 2016-06-28: 50 mg via ORAL
  Filled 2016-06-28: qty 1

## 2016-06-28 MED ORDER — POTASSIUM CHLORIDE CRYS ER 20 MEQ PO TBCR
40.0000 meq | EXTENDED_RELEASE_TABLET | Freq: Once | ORAL | Status: AC
  Administered 2016-06-28: 40 meq via ORAL
  Filled 2016-06-28: qty 2

## 2016-06-28 MED ORDER — DULOXETINE HCL 30 MG PO CPEP
30.0000 mg | ORAL_CAPSULE | Freq: Every day | ORAL | Status: DC
  Administered 2016-06-29: 30 mg via ORAL
  Filled 2016-06-28: qty 1

## 2016-06-28 MED ORDER — CHLORDIAZEPOXIDE HCL 5 MG PO CAPS: 25.0000 mg | ORAL_CAPSULE | Freq: Three times a day (TID) | ORAL | Status: DC

## 2016-06-28 MED ORDER — CHLORDIAZEPOXIDE HCL 5 MG PO CAPS: 25.0000 mg | ORAL_CAPSULE | Freq: Every day | ORAL | Status: DC

## 2016-06-28 MED ORDER — LORAZEPAM 2 MG/ML IJ SOLN
1.0000 mg | Freq: Once | INTRAMUSCULAR | Status: AC
  Administered 2016-06-28: 1 mg via INTRAVENOUS

## 2016-06-28 MED ORDER — CHLORDIAZEPOXIDE HCL 5 MG PO CAPS
25.0000 mg | ORAL_CAPSULE | Freq: Four times a day (QID) | ORAL | Status: DC | PRN
  Filled 2016-06-28: qty 5

## 2016-06-28 MED ORDER — CHLORDIAZEPOXIDE HCL 5 MG PO CAPS: 25.0000 mg | ORAL_CAPSULE | ORAL | Status: DC

## 2016-06-28 NOTE — Care Management Note (Signed)
Case Management Note  Patient Details  Name: Cathy Barker MRN: [id number] Date of Birth: Aug 27, 1970  Subjective/Objective:          CM following for progression and d/c planning.          Action/Plan: 11.24.2017 Noted PT recommendation of outpatient PT, if MD will provide pt with prescription she or her husband can contact her local PT/OT community provider[facility] [place] and provide this prescription in order for the pt to receive PT services.   Expected Discharge Date:                  Expected Discharge Plan:  Home/Self Care  In-House Referral:  Clinical Social Work  Discharge planning Services  CM Consult  Post Acute Care Choice:    Choice offered to:  Patient  DME Arranged:  N/A DME Agency:     HH Arranged:    HH Agency:     Status of Service:  In process, will continue to follow  If discussed at Long Length of Stay Meetings, dates discussed:    Additional Comments:  Starlyn SkeansRoyal, Shaniquia Brafford U, RN 06/28/2016, 3:40 PM

## 2016-06-28 NOTE — Progress Notes (Signed)
  Echocardiogram 2D Echocardiogram has been performed.  Cathy Barker, Cathy Barker 06/28/2016, 9:03 AM

## 2016-06-28 NOTE — Progress Notes (Signed)
[facility] PROGRESS NOTE  Marland Mcalpine. Justen ZOX:[id number] DOB: 12/31/1970 DOA: 06/26/2016  PCP: Pcp Not In System  Brief History/Interval Summary: 45 year old Caucasian female with a past medical history of chronic back pain, anxiety disorder, hypertension, alcohol abuse, and a questionable history of CHF, presented with worsening acute on chronic back pain. Patient apparently ordered, so he had a syncopal episode at home. Patient was found to be profoundly hypokalemic. She was hospitalized for further management.  Reason for Visit: Syncope. Alcohol withdrawal  Consultants: None  Procedures:  Transthoracic echocardiogram Study Conclusions  - Left ventricle: The cavity size was normal. Systolic function was   normal. The estimated ejection fraction was in the range of 55%   to 60%. Wall motion was normal; there were no regional wall   motion abnormalities. Left ventricular diastolic function   parameters were normal. - Mitral valve: There was mild regurgitation.  Antibiotics: None  Subjective/Interval History: Patient feels very anxious this morning. She tells me that she feels like she is having a panic attack. Unable to answer questions appropriately. Her husband is at the bedside. She is sitting up on the bed and moving her lower extremities without any problems. Has also ambulated to the bathroom per nursing staff.  ROS: Denies any nausea or vomiting  Objective:  Vital Signs  Vitals:   06/27/16 1729 06/27/16 2208 06/28/16 0600 06/28/16 0926  BP: 117/61 110/65  123/77  Pulse: 95 99 (!) 114 92  Resp: 17 18  17   Temp: 98.6 F (37 C) 97.6 F (36.4 C)  98.5 F (36.9 C)  TempSrc: Oral Oral  Oral  SpO2: 97% 99%  98%  Weight:  89.5 kg (197 lb 5 oz)    Height:        Intake/Output Summary (Last 24 hours) at 06/28/16 1002 Last data filed at 06/28/16 0221  Gross per 24 hour  Intake              240 ml  Output              400 ml  Net             -160  ml   Filed Weights   06/26/16 2133 06/27/16 2208  Weight: 89 kg (196 lb 1.6 oz) 89.5 kg (197 lb 5 oz)    General appearance: alert, cooperative, appears stated age and no distress Resp: clear to auscultation bilaterally Cardio: regular rate and rhythm, S1, S2 normal, no murmur, click, rub or gallop GI: soft, Nontender today.  bowel sounds normal; no masses,  no organomegaly Extremities: Able to move both her lower extremities without any difficulty. Neurologic: No obvious focal neurological deficits. Tremors noted.  Lab Results:  Data Reviewed: I have personally reviewed following labs and imaging studies  CBC:  Recent Labs Lab 06/26/16 1600 06/27/16 1059 06/28/16 0433  WBC 5.7 3.7* 3.2*  NEUTROABS 1.7  --   --   HGB 11.1* 11.4* 9.9*  HCT 32.9* 34.0* 29.8*  MCV 99.1 101.5* 103.1*  PLT 220 212 223    Basic Metabolic Panel:  Recent Labs Lab 06/26/16 1600 06/26/16 1843 06/27/16 1059 06/28/16 0433  NA 140  --  138 141  K 2.1*  --  3.4* 3.5  CL 97*  --  101 106  CO2 29  --  28 28  GLUCOSE 92  --  92 107*  BUN <5*  --  6 6  CREATININE 0.56  --  0.71 0.66  CALCIUM 9.0  --  8.3* 8.1*  MG  --  2.0  --  2.1  PHOS  --  2.3*  --   --     GFR: Estimated Creatinine Clearance: 101 mL/min (by C-G formula based on SCr of 0.66 mg/dL).  Liver Function Tests:  Recent Labs Lab 06/26/16 1600 06/27/16 1059 06/28/16 0433  AST 50* 44* 33  ALT 53 45 34  ALKPHOS 113 100 85  BILITOT 0.3 0.7 0.4  PROT 6.6 5.8* 5.1*  ALBUMIN 3.1* 3.0* 2.7*    Coagulation Profile:  Recent Labs Lab 06/27/16 0839  INR 1.02    Cardiac Enzymes:  Recent Labs Lab 06/26/16 2211 06/27/16 0346 06/27/16 0839  TROPONINI <0.03 <0.03 <0.03    CBG:  Recent Labs Lab 06/27/16 0743 06/28/16 0749  GLUCAP 106* 118*     Radiology Studies: X-ray Chest Pa And Lateral  Result Date: 06/26/2016 CLINICAL DATA:  Acute onset of syncope.  Initial encounter. EXAM: CHEST  2 VIEW COMPARISON:   Chest radiograph performed 02/06/2016 FINDINGS: The lungs are well-aerated. Pulmonary vascularity is at the upper limits of normal. There is no evidence of focal opacification, pleural effusion or pneumothorax. The heart is normal in size; the mediastinal contour is within normal limits. No acute osseous abnormalities are seen. IMPRESSION: No acute cardiopulmonary process seen. Electronically Signed   By: Roanna RaiderJeffery  Chang M.D.   On: 06/26/2016 23:45   [place] Abdomen Limited Ruq  Result Date: 06/27/2016 CLINICAL DATA:  Abdominal pain. EXAM: US ABDOMEN LIMITED - RIGHT UPPER QUADRANT COMPARISON:  None. FINDINGS: Gallbladder: The gallbladder is not visualized. The patient reports previous cholecystectomy. Common bile duct: Diameter: 5.2 mm Liver: Hepatic steatosis IMPRESSION: 1. Probable hepatic steatosis.  No other abnormalities. Electronically Signed   By: Gerome Samavid  Williams III M.D   On: 06/27/2016 17:19     Medications:  Scheduled: . folic acid  1 mg Oral Daily  . LORazepam  0-4 mg Intravenous Q6H   Followed by  . [START ON 06/29/2016] LORazepam  0-4 mg Intravenous Q12H  . LORazepam  2 mg Intravenous Once  . multivitamin with minerals  1 tablet Oral Daily  . sodium chloride flush  3 mL Intravenous Q12H  . thiamine  100 mg Oral Daily   Or  . thiamine  100 mg Intravenous Daily   Continuous:  NWG:NFAOZHYQMPRN:bisacodyl, HYDROcodone-acetaminophen, Influenza vac split quadrivalent PF, LORazepam **OR** LORazepam, naphazoline-pheniramine, pneumococcal 23 valent vaccine, polyethylene glycol, traMADol  Assessment/Plan:  Active Problems:   Hypokalemia   Syncope and collapse   Prolonged QT interval   Anxiety   Alcohol abuse    Hypokalemia Likely secondary to medication noncompliance and also recent episode of nausea vomiting diarrhea. Patient is on Lasix at home and was supposed to take potassium supplements along with that, but she had not been doing so. Magnesium is normal. Potassium was aggressively  repleted. Much improved this morning.  Syncope This occurred in a setting of patient being intoxicated but also may have prior history of CHF. EKG showed prolonged QT interval, which could be due to electrolyte imbalances. Repeat EKG is pending. Echocardiogram is as above. No need for further testing.  Prolonged QT interval Replace electrolytes. Continue to monitor on telemetry. Avoid QT prolonging medications. Repeat EKG.  Alcohol abuse with alcohol withdrawal syndrome Patient is noted to be tremulous. She is on the CIWA protocol. Has some discomfort in the right upper quadrant of her abdomen. She is status post cholecystectomy. Ultrasound reveals hepatic steatosis. Patient notified of findings. She has been counseled  to stop drinking. Continues to have some withdrawal symptoms.  Severe Anxiety Patient has been getting lorazepam as needed. Seems to be having difficulty maintaining focus this morning. Seems somewhat agitated. Her husband is at the bedside. She has never been on any long-acting antidepressant or antianxiety medication. She is worried about her alcohol intake. I think she will benefit from being seen by psychiatry. She may need some form of rehabilitation.   Low back pain, acute on chronic Patient has long-standing chronic low back pain. Recently exacerbated over the last few days. Does not have any neurological deficits. Pain Control. PT evaluation.   Normocytic anemia. No evidence for overt bleeding. Some drop in hemoglobin is likely dilutional. Continue to monitor.   DVT Prophylaxis: SCDs    Code Status: Full code  Family Communication: Discussed with the patient  Disposition Plan: Patient continues to have symptoms of alcohol withdrawal. Await improvement. Await evaluation by psychiatry and physical therapy.     LOS: 1 day   [facility] Greater HeightsKRISHNAN,Sharee Sturdy  [facility] Pager [phone] 06/28/2016, 10:02 AM  If 7PM-7AM, please contact night-coverage at [email or web address], password  [email or web address]

## 2016-06-28 NOTE — Progress Notes (Signed)
Upon entering patient room, patient lying on side in a relaxed position. Patients spouse woke patient up to request pain medication. Patient became hyperverbal and restless stating pain 9/10. Patient provided ordered pain medication, and heat pack provided at patient request. Sitter remains at bedside.
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
## 2016-06-28 NOTE — Evaluation (Signed)
Physical Therapy Evaluation Patient Details Name: Cathy Barker. Morr MRN: [id number] DOB: 10-19-1970 Today's Date: 06/28/2016   History of Present Illness  Karia Ehresman. Guevara is a 45 y.o. female with medical history significant of CHF, HTN and anxiety, alcohol abuse, admitted with worsening acute on chronic back pain and anxiety.  Clinical Impression  Pt is close to baseline functioning, but she is significantly deconditioned and could benefit from therapy or a structure activity outside the home.  She should be safe at home with intermittent assist. There are no further acute PT needs.  Will sign off at this time.     Follow Up Recommendations Outpatient PT;Other (comment) (OR structured exercise program outside the home.)    Equipment Recommendations  None recommended by PT    Recommendations for Other Services       Precautions / Restrictions Precautions Precautions: None      Mobility  Bed Mobility Overal bed mobility: Modified Independent                Transfers Overall transfer level: Modified independent                  Ambulation/Gait Ambulation/Gait assistance: Independent Ambulation Distance (Feet): 350 Feet Assistive device: None Gait Pattern/deviations: Step-through pattern Gait velocity: slower than age approp Gait velocity interpretation: Below normal speed for age/gender General Gait Details: steady and slower  Stairs Stairs: Yes Stairs assistance: Supervision Stair Management: One rail Right;Alternating pattern;Forwards Number of Stairs: 2 General stair comments: safe with rail  Wheelchair Mobility    Modified Rankin (Stroke Patients Only)       Balance Overall balance assessment: Needs assistance Sitting-balance support: No upper extremity supported Sitting balance-Leahy Scale: Good     Standing balance support: No upper extremity supported Standing balance-Leahy Scale: Good                                Pertinent Vitals/Pain Pain Assessment: Faces Faces Pain Scale: Hurts little more Pain Location: back Pain Descriptors / Indicators: Aching;Sore;Stabbing Pain Intervention(s): Monitored during session;Repositioned    Home Living Family/patient expects to be discharged to:: Private residence Living Arrangements: Spouse/significant other;Children Available Help at Discharge: Family;Available PRN/intermittently Type of Home: House Home Access: Stairs to enter     Home Layout: One level Home Equipment: None      Prior Function Level of Independence: Independent               Hand Dominance        Extremity/Trunk Assessment               Lower Extremity Assessment: Overall WFL for tasks assessed (proximal and truncal weakness)         Communication   Communication: No difficulties  Cognition Arousal/Alertness: Awake/alert Behavior During Therapy: WFL for tasks assessed/performed Overall Cognitive Status: Within Functional Limits for tasks assessed                      General Comments General comments (skin integrity, edema, etc.): Noticeable, but mild SOB and fatigue    Exercises     Assessment/Plan    PT Assessment All further PT needs can be met in the next venue of care  PT Problem List Decreased strength;Decreased activity tolerance;Decreased mobility;Pain          PT Treatment Interventions      PT Goals (Current goals can be found in the Care  Plan section)  Acute Rehab PT Goals Patient Stated Goal: I want to do more... PT Goal Formulation: With patient    Frequency     Barriers to discharge        Co-evaluation               End of Session   Activity Tolerance: Patient tolerated treatment well Patient left: in bed;with call bell/phone within reach Nurse Communication: Mobility status         Time: 4715-8063 PT Time Calculation (min) (ACUTE ONLY): 35 min   Charges:   PT Evaluation $PT Eval Low  Complexity: 1 Procedure PT Treatments $Gait Training: 8-22 mins   PT G CodesTessie Fass Timmey Lamba 06/28/2016, 3:14 PM  06/28/2016  Donnella Sham, PT [phone]  (pager)
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
## 2016-06-28 NOTE — Progress Notes (Signed)
Patient became verbally aggressive with this nurse concerning night time medications, patient questioning orders placed stating "things were not explained to me" and "i dont like the way things are going" patient appears very anxious at this time. Patient given ordered medication to address anxiety. Patients spouse at bedside also voicing concern and hostility toward this nurse concerning treatment plan. Attempted to explain to patient and family plan of care, but unable to verify patient understanding due to patient interrupting this nurse. Sitter remains at bedside

## 2016-06-29 LAB — CBC
HEMATOCRIT: 29.1 % — AB (ref 36.0–46.0)
HEMOGLOBIN: 9.5 g/dL — AB (ref 12.0–15.0)
MCH: 33.8 pg (ref 26.0–34.0)
MCHC: 32.6 g/dL (ref 30.0–36.0)
MCV: 103.6 fL — AB (ref 78.0–100.0)
PLATELETS: 256 10*3/uL (ref 150–400)
RBC: 2.81 MIL/uL — AB (ref 3.87–5.11)
RDW: 19.7 % — ABNORMAL HIGH (ref 11.5–15.5)
WBC: 3.7 10*3/uL — AB (ref 4.0–10.5)

## 2016-06-29 LAB — BASIC METABOLIC PANEL
ANION GAP: 8 (ref 5–15)
BUN: 5 mg/dL — ABNORMAL LOW (ref 6–20)
CHLORIDE: 106 mmol/L (ref 101–111)
CO2: 27 mmol/L (ref 22–32)
CREATININE: 0.59 mg/dL (ref 0.44–1.00)
Calcium: 8.4 mg/dL — ABNORMAL LOW (ref 8.9–10.3)
GFR calc non Af Amer: >60 mL/min (ref >60)
Glucose, Bld: 82 mg/dL (ref 65–99)
POTASSIUM: 3.4 mmol/L — AB (ref 3.5–5.1)
SODIUM: 141 mmol/L (ref 135–145)

## 2016-06-29 LAB — GLUCOSE, CAPILLARY: GLUCOSE-CAPILLARY: 91 mg/dL (ref 65–99)

## 2016-06-29 MED ORDER — FUROSEMIDE 40 MG PO TABS: 40.0000 mg | ORAL_TABLET | Freq: Every day | ORAL | 0 refills | Status: DC

## 2016-06-29 MED ORDER — DULOXETINE HCL 30 MG PO CPEP: 30.0000 mg | ORAL_CAPSULE | Freq: Every day | ORAL | 0 refills | Status: DC

## 2016-06-29 MED ORDER — FOLIC ACID 1 MG PO TABS: 1.0000 mg | ORAL_TABLET | Freq: Every day | ORAL | 0 refills | Status: DC

## 2016-06-29 MED ORDER — CHLORDIAZEPOXIDE HCL 25 MG PO CAPS: ORAL_CAPSULE | ORAL | 0 refills | Status: DC

## 2016-06-29 MED ORDER — TRAZODONE HCL 50 MG PO TABS: 50.0000 mg | ORAL_TABLET | Freq: Every evening | ORAL | 0 refills | Status: DC | PRN

## 2016-06-29 MED ORDER — POTASSIUM CHLORIDE ER 10 MEQ PO TBCR: 20.0000 meq | EXTENDED_RELEASE_TABLET | Freq: Every day | ORAL | 0 refills | Status: AC

## 2016-06-29 MED ORDER — THIAMINE HCL 100 MG PO TABS: 100.0000 mg | ORAL_TABLET | Freq: Every day | ORAL | 0 refills | Status: DC

## 2016-06-29 NOTE — Discharge Instructions (Signed)

## 2016-06-29 NOTE — Progress Notes (Addendum)
Discharge instructions and medications discussed with patient and spouse.   Prescriptions given to patient.  All questions answered. Patient refused flu/pna vaccine.

## 2016-06-29 NOTE — Discharge Summary (Signed)
[facility]  Physician Discharge Summary   Patient ID: Cathy McalpineStephanie O. Barker MRN: [id number] DOB/AGE: 1971/02/10 45 y.o.  Admit date: 06/26/2016 Discharge date: 06/29/2016  PCP: Pcp Not In System  DISCHARGE DIAGNOSES:  Principal Problem:   Alcohol withdrawal (HCC) Active Problems:   Hypokalemia   Syncope and collapse   Prolonged QT interval   Anxiety   Alcohol abuse   RECOMMENDATIONS FOR OUTPATIENT FOLLOW UP: 1. Patient instructed to follow-up with psychiatry   DISCHARGE CONDITION: fair  Diet recommendation: Has before  [facility]   06/26/16 2133 06/27/16 2208  Weight: 89 kg (196 lb 1.6 oz) 89.5 kg (197 lb 5 oz)    INITIAL HISTORY: [age] year old Caucasian female with a past medical history of chronic back pain, anxiety disorder, hypertension, alcohol abuse, and a questionable history of CHF, presented with worsening acute on chronic back pain. Patient apparently ordered, so he had a syncopal episode at home. Patient was found to be profoundly hypokalemic. She was hospitalized for further management.  Consultations:  None  Procedures: Transthoracic echocardiogram Study Conclusions  - Left ventricle: The cavity size was normal. Systolic function was normal. The estimated ejection fraction was in the range of 55% to 60%. Wall motion was normal; there were no regional wall motion abnormalities. Left ventricular diastolic function parameters were normal. - Mitral valve: There was mild regurgitation.   HOSPITAL COURSE:   Hypokalemia Likely secondary to medication noncompliance and also recent episode of nausea vomiting diarrhea. Patient was on Lasix at home and was supposed to take potassium supplements along with that, but she had not been doing so. Magnesium is normal. Potassium was aggressively repleted. Electrolytes have improved.  Syncope This occurred in a setting of patient being intoxicated but also may have prior history of CHF. EKG  showed prolonged QT interval, which could be due to electrolyte imbalances. Repeat EKG was normal QT interval. Echocardiogram is as above. No need for further testing.  Prolonged QT interval Replace electrolytes. Continue to monitor on telemetry. Avoid QT prolonging medications. QT interval has normalized.  Alcohol abuse with alcohol withdrawal syndrome Patient was noted to be tremulous. Patient was placed on CIWA protocol. Ultrasound of the abdomen showed hepatic steatosis. Patient was counseled to stop drinking alcohol. Continued to have some withdrawal symptoms. Librium was recommended by psychiatry. She is feeling much better this morning. Her vital signs are stable. She wants to go home. She will follow-up with psychiatry-[facility].   Severe Anxiety Patient appears to have severe anxiety. Psychiatry was consulted. Patient started on Librium for withdrawal and Cymbalta. She has been told to follow-up with psychiatry-group. Psychiatry did not feel like patient needed inpatient psychiatric care.   Low back pain, acute on chronic Patient has long-standing chronic low back pain. Recently exacerbated over the last few days. Does not have any neurological deficits. Pain Control. Patient has been ambulating without any difficulties.  Normocytic anemia. No evidence for overt bleeding. Some drop in hemoglobin is likely dilutional.   Overall improved. Wishes to go home. Okay for discharge.   PERTINENT LABS:  The results of significant diagnostics from this hospitalization (including imaging, microbiology, ancillary and laboratory) are listed below for reference.     Labs: Basic Metabolic Panel:  Recent Labs Lab 06/26/16 1600 06/26/16 1843 06/27/16 1059 06/28/16 0433 06/29/16 0603  NA 140  --  138 141 141  K 2.1*  --  3.4* 3.5 3.4*  CL 97*  --  101 106 106  CO2 29  --  28 28  27  GLUCOSE 92  --  92 107* 82  BUN <5*  --  6 6 5*  CREATININE 0.56  --  0.71 0.66 0.59  CALCIUM 9.0  --   8.3* 8.1* 8.4*  MG  --  2.0  --  2.1  --   PHOS  --  2.3*  --   --   --    Liver Function Tests:  Recent Labs Lab 06/26/16 1600 06/27/16 1059 06/28/16 0433  AST 50* 44* 33  ALT 53 45 34  ALKPHOS 113 100 85  BILITOT 0.3 0.7 0.4  PROT 6.6 5.8* 5.1*  ALBUMIN 3.1* 3.0* 2.7*   CBC:  Recent Labs Lab 06/26/16 1600 06/27/16 1059 06/28/16 0433 06/29/16 0603  WBC 5.7 3.7* 3.2* 3.7*  NEUTROABS 1.7  --   --   --   HGB 11.1* 11.4* 9.9* 9.5*  HCT 32.9* 34.0* 29.8* 29.1*  MCV 99.1 101.5* 103.1* 103.6*  PLT 220 212 223 256   Cardiac Enzymes:  Recent Labs Lab 06/26/16 2211 06/27/16 0346 06/27/16 0839  TROPONINI <0.03 <0.03 <0.03    CBG:  Recent Labs Lab 06/27/16 0743 06/28/16 0749 06/29/16 0807  GLUCAP 106* 118* 91     IMAGING STUDIES X-ray Chest Pa And Lateral  Result Date: 06/26/2016 CLINICAL DATA:  Acute onset of syncope.  Initial encounter. EXAM: CHEST  2 VIEW COMPARISON:  Chest radiograph performed 02/06/2016 FINDINGS: The lungs are well-aerated. Pulmonary vascularity is at the upper limits of normal. There is no evidence of focal opacification, pleural effusion or pneumothorax. The heart is normal in size; the mediastinal contour is within normal limits. No acute osseous abnormalities are seen. IMPRESSION: No acute cardiopulmonary process seen. Electronically Signed   By: Roanna Raider M.D.   On: 06/26/2016 23:45   US Abdomen Limited Ruq  Result Date: 06/27/2016 CLINICAL DATA:  Abdominal pain. EXAM: US ABDOMEN LIMITED - RIGHT UPPER QUADRANT COMPARISON:  None. FINDINGS: Gallbladder: The gallbladder is not visualized. The patient reports previous cholecystectomy. Common bile duct: Diameter: 5.2 mm Liver: Hepatic steatosis IMPRESSION: 1. Probable hepatic steatosis.  No other abnormalities. Electronically Signed   By: Gerome Sam III M.D   On: 06/27/2016 17:19    DISCHARGE EXAMINATION: Vitals:   06/28/16 1757 06/28/16 2311 06/29/16 0650 06/29/16 0900  BP:  122/78 130/61 (!) 109/53 115/60  Pulse: 99 95 92 95  Resp: 18 18 20 18   Temp: 98.4 F (36.9 C) 98.4 F (36.9 C) 98.8 F (37.1 C) 98.5 F (36.9 C)  TempSrc: Oral   Oral  SpO2: 99% 96% 96% 98%  Weight:      Height:       General appearance: alert, cooperative, appears stated age and no distress Resp: clear to auscultation bilaterally Cardio: regular rate and rhythm, S1, S2 normal, no murmur, click, rub or gallop GI: soft, non-tender; bowel sounds normal; no masses,  no organomegaly Extremities: extremities normal, atraumatic, no cyanosis or edema  DISPOSITION: Home  Discharge Instructions    Call MD for:  difficulty breathing, headache or visual disturbances    Complete by:  As directed    Call MD for:  extreme fatigue    Complete by:  As directed    Call MD for:  persistant dizziness or light-headedness    Complete by:  As directed    Call MD for:  persistant nausea and vomiting    Complete by:  As directed    Call MD for:  severe uncontrolled pain  Complete by:  As directed    Call MD for:  temperature >100.4    Complete by:  As directed    Diet - low sodium heart healthy    Complete by:  As directed    Discharge instructions    Complete by:  As directed    Please take your medications as prescribed. Avoid taking Xanax while taking the librium. Minimize use of pain medications as well. Be sure to follow up with a psychiatrist. Please stop drinking alcohol.  You were cared for by a hospitalist during your hospital stay. If you have any questions about your discharge medications or the care you received while you were in the hospital after you are discharged, you can call the unit and asked to speak with the hospitalist on call if the hospitalist that took care of you is not available. Once you are discharged, your primary care physician will handle any further medical issues. Please note that NO REFILLS for any discharge medications will be authorized once you are discharged,  as it is imperative that you return to your primary care physician (or establish a relationship with a primary care physician if you do not have one) for your aftercare needs so that they can reassess your need for medications and monitor your lab values. If you do not have a primary care physician, you can call [phone] for a physician referral.   Increase activity slowly    Complete by:  As directed       ALLERGIES: No Known Allergies   Discharge Medication List as of 06/29/2016 11:45 AM    START taking these medications   Details  chlordiazePOXIDE (LIBRIUM) 25 MG capsule Take 1 tablet 4 times a day (every 6 hours) for 1 day, then take 1 tablet three times a day (every 8 hours) for 1 day, then take 1 tablet twice a day for 1 day, then take 1 tablet once a day for 1 day., Print    DULoxetine (CYMBALTA) 30 MG capsule Take 1 capsule (30 mg total) by mouth daily., Starting Sun 06/30/2016, Print    folic acid (FOLVITE) 1 MG tablet Take 1 tablet (1 mg total) by mouth daily., Starting Sun 06/30/2016, Print    potassium chloride (K-DUR) 10 MEQ tablet Take 2 tablets (20 mEq total) by mouth daily., Starting Sat 06/29/2016, Print    thiamine 100 MG tablet Take 1 tablet (100 mg total) by mouth daily., Starting Sun 06/30/2016, Print    traZODone (DESYREL) 50 MG tablet Take 1 tablet (50 mg total) by mouth at bedtime as needed for sleep., Starting Sat 06/29/2016, Print      CONTINUE these medications which have CHANGED   Details  furosemide (LASIX) 40 MG tablet Take 1 tablet (40 mg total) by mouth daily., Starting Sat 06/29/2016, Print      CONTINUE these medications which have NOT CHANGED   Details  ALPRAZolam (XANAX) 1 MG tablet Take 1 mg by mouth 3 (three) times daily., Historical Med    HYDROcodone-acetaminophen (NORCO) 10-325 MG tablet Take 1 tablet by mouth every 6 (six) hours as needed for moderate pain., Historical Med    naphazoline-pheniramine (NAPHCON-A) 0.025-0.3 % ophthalmic  solution Place 1 drop into both eyes daily as needed for irritation., Historical Med    traMADol (ULTRAM) 50 MG tablet Take 50 mg by mouth every 6 (six) hours as needed for moderate pain., Historical Med         Follow-up Information    GULLAPALLI, SWARUPARANI,  MD. Schedule an appointment as soon as possible for a visit in 1 week(s).   Specialty:  Psychiatry Contact information: [address] [place] [address] [phone]           TOTAL DISCHARGE TIME: 35 mins  [facility] Surgery CenterKRISHNAN,Kember Boch  [facility] Pager [phone]  06/29/2016, 3:05 PM

## 2016-09-10 DIAGNOSIS — F10929 Alcohol use, unspecified with intoxication, unspecified: Secondary | ICD-10-CM

## 2016-09-10 DIAGNOSIS — E872 Acidosis: Secondary | ICD-10-CM

## 2016-09-10 DIAGNOSIS — F13239 Sedative, hypnotic or anxiolytic dependence with withdrawal, unspecified: Secondary | ICD-10-CM

## 2016-09-10 DIAGNOSIS — F1310 Sedative, hypnotic or anxiolytic abuse, uncomplicated: Secondary | ICD-10-CM

## 2016-09-10 DIAGNOSIS — F10239 Alcohol dependence with withdrawal, unspecified: Secondary | ICD-10-CM

## 2016-09-10 DIAGNOSIS — E876 Hypokalemia: Secondary | ICD-10-CM

## 2016-11-19 DIAGNOSIS — F10239 Alcohol dependence with withdrawal, unspecified: Secondary | ICD-10-CM

## 2016-11-19 DIAGNOSIS — F1123 Opioid dependence with withdrawal: Secondary | ICD-10-CM

## 2016-11-19 DIAGNOSIS — F1310 Sedative, hypnotic or anxiolytic abuse, uncomplicated: Secondary | ICD-10-CM

## 2016-11-19 DIAGNOSIS — F13231 Sedative, hypnotic or anxiolytic dependence with withdrawal delirium: Secondary | ICD-10-CM

## 2016-11-19 DIAGNOSIS — E876 Hypokalemia: Secondary | ICD-10-CM

## 2016-11-20 DIAGNOSIS — M549 Dorsalgia, unspecified: Secondary | ICD-10-CM

## 2016-11-20 DIAGNOSIS — F1010 Alcohol abuse, uncomplicated: Secondary | ICD-10-CM

## 2017-01-07 ENCOUNTER — Inpatient Hospital Stay (HOSPITAL_COMMUNITY)
Admission: AD | Admit: 2017-01-07 | Discharge: 2017-01-13 | DRG: 897 | Disposition: A | Discharge status: Home / Self Care | Payer: No Typology Code available for payment source | Source: Intra-hospital | Attending: Psychiatry | Admitting: Psychiatry

## 2017-01-07 ENCOUNTER — Encounter (HOSPITAL_COMMUNITY): Payer: Self-pay

## 2017-01-07 DIAGNOSIS — Z6834 Body mass index (BMI) 34.0-34.9, adult: Secondary | ICD-10-CM | POA: Diagnosis not present

## 2017-01-07 DIAGNOSIS — F1099 Alcohol use, unspecified with unspecified alcohol-induced disorder: Secondary | ICD-10-CM | POA: Diagnosis not present

## 2017-01-07 DIAGNOSIS — M545 Low back pain: Secondary | ICD-10-CM | POA: Diagnosis not present

## 2017-01-07 DIAGNOSIS — F411 Generalized anxiety disorder: Secondary | ICD-10-CM | POA: Diagnosis present

## 2017-01-07 DIAGNOSIS — F1024 Alcohol dependence with alcohol-induced mood disorder: Principal | ICD-10-CM | POA: Diagnosis present

## 2017-01-07 DIAGNOSIS — R197 Diarrhea, unspecified: Secondary | ICD-10-CM | POA: Diagnosis present

## 2017-01-07 DIAGNOSIS — F1190 Opioid use, unspecified, uncomplicated: Secondary | ICD-10-CM | POA: Diagnosis not present

## 2017-01-07 DIAGNOSIS — F410 Panic disorder [episodic paroxysmal anxiety] without agoraphobia: Secondary | ICD-10-CM | POA: Diagnosis present

## 2017-01-07 DIAGNOSIS — F1994 Other psychoactive substance use, unspecified with psychoactive substance-induced mood disorder: Secondary | ICD-10-CM | POA: Diagnosis present

## 2017-01-07 DIAGNOSIS — F4310 Post-traumatic stress disorder, unspecified: Secondary | ICD-10-CM | POA: Diagnosis not present

## 2017-01-07 DIAGNOSIS — E669 Obesity, unspecified: Secondary | ICD-10-CM | POA: Diagnosis present

## 2017-01-07 DIAGNOSIS — G4700 Insomnia, unspecified: Secondary | ICD-10-CM | POA: Diagnosis not present

## 2017-01-07 DIAGNOSIS — G8929 Other chronic pain: Secondary | ICD-10-CM | POA: Diagnosis not present

## 2017-01-07 DIAGNOSIS — F332 Major depressive disorder, recurrent severe without psychotic features: Secondary | ICD-10-CM | POA: Diagnosis not present

## 2017-01-07 DIAGNOSIS — F329 Major depressive disorder, single episode, unspecified: Secondary | ICD-10-CM | POA: Diagnosis present

## 2017-01-07 DIAGNOSIS — F17210 Nicotine dependence, cigarettes, uncomplicated: Secondary | ICD-10-CM | POA: Diagnosis present

## 2017-01-07 DIAGNOSIS — M797 Fibromyalgia: Secondary | ICD-10-CM | POA: Diagnosis present

## 2017-01-07 DIAGNOSIS — F39 Unspecified mood [affective] disorder: Secondary | ICD-10-CM | POA: Diagnosis present

## 2017-01-07 DIAGNOSIS — E876 Hypokalemia: Secondary | ICD-10-CM | POA: Diagnosis present

## 2017-01-07 HISTORY — DX: Depression, unspecified: F32.A

## 2017-01-07 HISTORY — DX: Major depressive disorder, single episode, unspecified: F32.9

## 2017-01-07 HISTORY — DX: Anxiety disorder, unspecified: F41.9

## 2017-01-07 MED ORDER — ONDANSETRON 4 MG PO TBDP: 4.0000 mg | ORAL_TABLET | Freq: Four times a day (QID) | ORAL | Status: DC | PRN

## 2017-01-07 MED ORDER — ALUM & MAG HYDROXIDE-SIMETH 200-200-20 MG/5ML PO SUSP: 30.0000 mL | ORAL | Status: DC | PRN

## 2017-01-07 MED ORDER — THIAMINE HCL 100 MG/ML IJ SOLN: 100.0000 mg | Freq: Once | INTRAMUSCULAR | Status: DC

## 2017-01-07 MED ORDER — TRAZODONE HCL 50 MG PO TABS
50.0000 mg | ORAL_TABLET | Freq: Every evening | ORAL | Status: DC | PRN
  Administered 2017-01-08 – 2017-01-12 (×9): 50 mg via ORAL
  Filled 2017-01-07 (×15): qty 1

## 2017-01-07 MED ORDER — ACETAMINOPHEN 325 MG PO TABS
650.0000 mg | ORAL_TABLET | Freq: Four times a day (QID) | ORAL | Status: DC | PRN
  Administered 2017-01-07 – 2017-01-12 (×4): 650 mg via ORAL
  Filled 2017-01-07 (×4): qty 2

## 2017-01-07 MED ORDER — CHLORDIAZEPOXIDE HCL 25 MG PO CAPS
25.0000 mg | ORAL_CAPSULE | Freq: Every day | ORAL | Status: AC
  Administered 2017-01-11: 25 mg via ORAL
  Filled 2017-01-07: qty 1

## 2017-01-07 MED ORDER — NICOTINE 21 MG/24HR TD PT24
21.0000 mg | MEDICATED_PATCH | Freq: Every day | TRANSDERMAL | Status: DC
  Administered 2017-01-08 – 2017-01-13 (×6): 21 mg via TRANSDERMAL
  Filled 2017-01-07 (×8): qty 1

## 2017-01-07 MED ORDER — LOPERAMIDE HCL 2 MG PO CAPS: 2.0000 mg | ORAL_CAPSULE | ORAL | Status: DC | PRN

## 2017-01-07 MED ORDER — CHLORDIAZEPOXIDE HCL 25 MG PO CAPS
25.0000 mg | ORAL_CAPSULE | Freq: Four times a day (QID) | ORAL | Status: AC
  Administered 2017-01-08 (×4): 25 mg via ORAL
  Filled 2017-01-07 (×4): qty 1

## 2017-01-07 MED ORDER — VITAMIN B-1 100 MG PO TABS
100.0000 mg | ORAL_TABLET | Freq: Every day | ORAL | Status: DC
  Administered 2017-01-08 – 2017-01-13 (×6): 100 mg via ORAL
  Filled 2017-01-07 (×8): qty 1

## 2017-01-07 MED ORDER — HYDROXYZINE HCL 25 MG PO TABS
25.0000 mg | ORAL_TABLET | Freq: Three times a day (TID) | ORAL | Status: DC | PRN
  Administered 2017-01-08 – 2017-01-13 (×6): 25 mg via ORAL
  Filled 2017-01-07: qty 10
  Filled 2017-01-07 (×6): qty 1

## 2017-01-07 MED ORDER — DULOXETINE HCL 30 MG PO CPEP
30.0000 mg | ORAL_CAPSULE | Freq: Every day | ORAL | Status: DC
  Administered 2017-01-08 – 2017-01-13 (×6): 30 mg via ORAL
  Filled 2017-01-07 (×5): qty 1
  Filled 2017-01-07: qty 7
  Filled 2017-01-07 (×2): qty 1

## 2017-01-07 MED ORDER — CHLORDIAZEPOXIDE HCL 25 MG PO CAPS
25.0000 mg | ORAL_CAPSULE | Freq: Three times a day (TID) | ORAL | Status: AC
  Administered 2017-01-09 (×3): 25 mg via ORAL
  Filled 2017-01-07 (×3): qty 1

## 2017-01-07 MED ORDER — MAGNESIUM HYDROXIDE 400 MG/5ML PO SUSP: 30.0000 mL | Freq: Every day | ORAL | Status: DC | PRN

## 2017-01-07 MED ORDER — ADULT MULTIVITAMIN W/MINERALS CH
1.0000 | ORAL_TABLET | Freq: Every day | ORAL | Status: DC
  Administered 2017-01-08 – 2017-01-13 (×6): 1 via ORAL
  Filled 2017-01-07 (×8): qty 1

## 2017-01-07 MED ORDER — CHLORDIAZEPOXIDE HCL 25 MG PO CAPS
25.0000 mg | ORAL_CAPSULE | Freq: Four times a day (QID) | ORAL | Status: AC | PRN
  Administered 2017-01-07 – 2017-01-10 (×2): 25 mg via ORAL
  Filled 2017-01-07 (×2): qty 1

## 2017-01-07 MED ORDER — TRAZODONE HCL 50 MG PO TABS
ORAL_TABLET | ORAL | Status: DC
Start: 2017-01-07 — End: 2017-01-07
  Filled 2017-01-07: qty 1

## 2017-01-07 MED ORDER — CHLORDIAZEPOXIDE HCL 25 MG PO CAPS
25.0000 mg | ORAL_CAPSULE | ORAL | Status: AC
  Administered 2017-01-10 (×2): 25 mg via ORAL
  Filled 2017-01-07 (×2): qty 1

## 2017-01-07 NOTE — BH Assessment (Signed)
Tele Assessment Note   Cathy McalpineStephanie O. Aleda GranaShinault is an 46 y.o. female presenting to the [facility] ED with excessive use of alcohol and other drugs, including benzodiazepine and oxycodone.  The patient expressed a long history of anxiety and panic, addressed with a prescription of xanax by her PCP. However, the patient was also using alcohol daily, up to a 1/5th of liquor. The patients PCP discontinued her xanax prescription which result in increased alcohol use. The patient had a positive UDS for benzodiazapine and oxycodone.  Reports heaviest use of drugs and alcohol over the last 2 yrs. The patient lives with her husband and two children.   Patient had depressed  and anxious mood, tearful affect, poor insight and judgment, crying spells, sadness, sleep changes, despondent, insomnia, self pity, oriented to time, person and place.  Reports mood swings. Denies SI, HI or A/v. Patient was recently medically admitted due to alcohol use withdrawal. Patient is a risk to herself due to continued use of alcohol and drugs despite medical issues and past acute withdrawal symptoms.    Diagnosis: MDD, recurrent severe, without psychosis, PTSD; Alcohol use disorder, severe; Opiate use disorder, severe  Past Medical History: No past medical history on file.  No past surgical history on file.  Family History:  Family History  Problem Relation Age of Onset  . Diabetes Mother   . Diabetes Father   . Lymphoma Father   . CAD Father   . Diabetes Other   . Cancer Other   . CAD Other     Social History:  reports that she has been smoking.  She has never used smokeless tobacco. She reports that she drinks alcohol. She reports that she does not use drugs.  Additional Social History:  Alcohol / Drug Use Pain Medications: see MAR Prescriptions: see MAR Over the Counter: see MAR  CIWA:   COWS:    PATIENT STRENGTHS: (choose at least two) Average or above average intelligence General fund of  knowledge  Allergies: No Known Allergies  Home Medications:  (Not in a hospital admission)  OB/GYN Status:  No LMP recorded.  General Assessment Data Location of Assessment: [facility] TTS Assessment: Out of system Is this a Tele or Face-to-Face Assessment?: Tele Assessment Is this an Initial Assessment or a Re-assessment for this encounter?: Initial Assessment Marital status: Married Is patient pregnant?: No Pregnancy Status: No Living Arrangements: Spouse/significant other, Children Can pt return to current living arrangement?: Yes Admission Status: Voluntary Is patient capable of signing voluntary admission?: Yes Referral Source: Self/Family/Friend Insurance type: self pay  Medical Screening Exam [facility] Walk-in ONLY) Medical Exam completed: Yes  Crisis Care Plan Living Arrangements: Spouse/significant other, Children Name of Psychiatrist: n/a Name of Therapist: n/a  Education Status Is patient currently in school?: No  Risk to self with the past 6 months Suicidal Ideation: No Has patient been a risk to self within the past 6 months prior to admission? : No Suicidal Intent: No Has patient had any suicidal intent within the past 6 months prior to admission? : No Is patient at risk for suicide?: No Suicidal Plan?: No Has patient had any suicidal plan within the past 6 months prior to admission? : No Access to Means: No What has been your use of drugs/alcohol within the last 12 months?: alcohol, benzo, oxycodone Previous Attempts/Gestures: No How many times?: 0 Other Self Harm Risks: 0 Intentional Self Injurious Behavior: None Family Suicide History: Unknown Recent stressful life event(s): Other (Comment) (continued drug use) Persecutory  voices/beliefs?: No Depression: Yes Depression Symptoms: Despondent, Insomnia, Tearfulness, Loss of interest in usual pleasures, Feeling worthless/self pity Substance abuse history and/or treatment for substance abuse?:  Yes Suicide prevention information given to non-admitted patients: Not applicable  Risk to Others within the past 6 months Homicidal Ideation: No Does patient have any lifetime risk of violence toward others beyond the six months prior to admission? : No Thoughts of Harm to Others: No Current Homicidal Intent: No Current Homicidal Plan: No Access to Homicidal Means: No History of harm to others?: No Assessment of Violence: None Noted Does patient have access to weapons?: No Criminal Charges Pending?: No Does patient have a court date: No Is patient on probation?: No  Psychosis Hallucinations: None noted Delusions: None noted  Mental Status Report Appearance/Hygiene: Unremarkable Motor Activity: Freedom of movement Speech: Logical/coherent Level of Consciousness: Alert Mood: Depressed, Anxious Affect: Anxious Anxiety Level: Panic Attacks Panic attack frequency: unknown Thought Processes: Coherent, Relevant Judgement: Impaired Orientation: Person, Place, Time Obsessive Compulsive Thoughts/Behaviors: Unable to Assess  Cognitive Functioning Concentration: Normal Memory: Recent Intact, Remote Intact IQ: Average Insight: Poor Impulse Control: Poor Appetite: Poor Sleep: Decreased  ADLScreening [facility] Assessment Services) Patient's cognitive ability adequate to safely complete daily activities?: Yes Patient able to express need for assistance with ADLs?: Yes Independently performs ADLs?: Yes (appropriate for developmental age)  Prior Inpatient Therapy Prior Inpatient Therapy: No  Prior Outpatient Therapy Prior Outpatient Therapy: No Does patient have an ACCT team?: No Does patient have Intensive In-House Services?  : No Does patient have [facility] services? : No Does patient have P4CC services?: No  ADL Screening (condition at time of admission) Patient's cognitive ability adequate to safely complete daily activities?: Yes Is the patient deaf or have difficulty  hearing?: No Does the patient have difficulty seeing, even when wearing glasses/contacts?: No Does the patient have difficulty concentrating, remembering, or making decisions?: No Patient able to express need for assistance with ADLs?: Yes Does the patient have difficulty dressing or bathing?: No Independently performs ADLs?: Yes (appropriate for developmental age)       Abuse/Neglect Assessment (Assessment to be complete while patient is alone) Physical Abuse: Denies Verbal Abuse: Denies Sexual Abuse: Denies     Merchant navy officer (For Healthcare) Does Patient Have a Medical Advance Directive?: No    Additional Information 1:1 In Past 12 Months?: No CIRT Risk: No Elopement Risk: No Does patient have medical clearance?: Yes     Disposition:  Disposition Initial Assessment Completed for this Encounter: Yes Disposition of Patient: Inpatient treatment program Type of inpatient treatment program: Adult  Westley Hummer 01/07/2017 2:00 PM

## 2017-01-08 ENCOUNTER — Other Ambulatory Visit: Payer: Self-pay

## 2017-01-08 DIAGNOSIS — F17210 Nicotine dependence, cigarettes, uncomplicated: Secondary | ICD-10-CM

## 2017-01-08 DIAGNOSIS — F1099 Alcohol use, unspecified with unspecified alcohol-induced disorder: Secondary | ICD-10-CM

## 2017-01-08 DIAGNOSIS — F1190 Opioid use, unspecified, uncomplicated: Secondary | ICD-10-CM

## 2017-01-08 DIAGNOSIS — E669 Obesity, unspecified: Secondary | ICD-10-CM

## 2017-01-08 DIAGNOSIS — Z6834 Body mass index (BMI) 34.0-34.9, adult: Secondary | ICD-10-CM

## 2017-01-08 DIAGNOSIS — F1994 Other psychoactive substance use, unspecified with psychoactive substance-induced mood disorder: Secondary | ICD-10-CM

## 2017-01-08 DIAGNOSIS — G8929 Other chronic pain: Secondary | ICD-10-CM

## 2017-01-08 DIAGNOSIS — F4310 Post-traumatic stress disorder, unspecified: Secondary | ICD-10-CM

## 2017-01-08 DIAGNOSIS — F332 Major depressive disorder, recurrent severe without psychotic features: Secondary | ICD-10-CM

## 2017-01-08 LAB — LIPID PANEL
Cholesterol: 237 mg/dL — ABNORMAL HIGH (ref 0–200)
HDL: 94 mg/dL (ref >40)
LDL CALC: 109 mg/dL — AB (ref 0–99)
Total CHOL/HDL Ratio: 2.5 RATIO
Triglycerides: 172 mg/dL — ABNORMAL HIGH (ref <150)
VLDL: 34 mg/dL (ref 0–40)

## 2017-01-08 LAB — TSH: TSH: 1.529 u[IU]/mL (ref 0.350–4.500)

## 2017-01-08 MED ORDER — CLONIDINE HCL 0.1 MG PO TABS
0.1000 mg | ORAL_TABLET | Freq: Every day | ORAL | Status: AC
  Administered 2017-01-12 – 2017-01-13 (×2): 0.1 mg via ORAL
  Filled 2017-01-08 (×3): qty 1

## 2017-01-08 MED ORDER — GABAPENTIN 600 MG PO TABS
600.0000 mg | ORAL_TABLET | Freq: Three times a day (TID) | ORAL | Status: DC
  Administered 2017-01-08 – 2017-01-13 (×15): 600 mg via ORAL
  Filled 2017-01-08 (×3): qty 1
  Filled 2017-01-08 (×2): qty 21
  Filled 2017-01-08 (×8): qty 1
  Filled 2017-01-08: qty 21
  Filled 2017-01-08 (×8): qty 1

## 2017-01-08 MED ORDER — DICYCLOMINE HCL 20 MG PO TABS
20.0000 mg | ORAL_TABLET | Freq: Four times a day (QID) | ORAL | Status: AC | PRN
  Administered 2017-01-09 – 2017-01-12 (×8): 20 mg via ORAL
  Filled 2017-01-08 (×8): qty 1

## 2017-01-08 MED ORDER — IBUPROFEN 600 MG PO TABS
600.0000 mg | ORAL_TABLET | Freq: Four times a day (QID) | ORAL | Status: DC | PRN
  Administered 2017-01-08 – 2017-01-13 (×15): 600 mg via ORAL
  Filled 2017-01-08 (×14): qty 1

## 2017-01-08 MED ORDER — METHOCARBAMOL 500 MG PO TABS
500.0000 mg | ORAL_TABLET | Freq: Three times a day (TID) | ORAL | Status: AC | PRN
  Administered 2017-01-08 – 2017-01-12 (×9): 500 mg via ORAL
  Filled 2017-01-08 (×9): qty 1

## 2017-01-08 MED ORDER — ONDANSETRON 4 MG PO TBDP
4.0000 mg | ORAL_TABLET | Freq: Three times a day (TID) | ORAL | Status: DC | PRN
  Administered 2017-01-08 – 2017-01-11 (×7): 4 mg via ORAL
  Filled 2017-01-08 (×8): qty 1

## 2017-01-08 MED ORDER — CLONIDINE HCL 0.1 MG PO TABS
0.1000 mg | ORAL_TABLET | ORAL | Status: AC
  Administered 2017-01-10 – 2017-01-11 (×4): 0.1 mg via ORAL
  Filled 2017-01-08 (×5): qty 1

## 2017-01-08 MED ORDER — LOPERAMIDE HCL 2 MG PO CAPS
2.0000 mg | ORAL_CAPSULE | ORAL | Status: DC | PRN
  Administered 2017-01-08: 2 mg via ORAL
  Administered 2017-01-08: 4 mg via ORAL
  Administered 2017-01-08 – 2017-01-11 (×9): 2 mg via ORAL
  Filled 2017-01-08 (×8): qty 1
  Filled 2017-01-08: qty 2
  Filled 2017-01-08 (×3): qty 1

## 2017-01-08 MED ORDER — CLONIDINE HCL 0.1 MG PO TABS
0.1000 mg | ORAL_TABLET | Freq: Four times a day (QID) | ORAL | Status: AC
  Administered 2017-01-08 – 2017-01-09 (×7): 0.1 mg via ORAL
  Filled 2017-01-08 (×8): qty 1

## 2017-01-08 NOTE — Tx Team (Signed)
Initial Treatment Plan 01/08/2017 1:22 AM Cathy Barker YQM:[id number]    PATIENT STRESSORS: Financial difficulties Substance abuse   PATIENT STRENGTHS: DentistCommunication skills General fund of knowledge Motivation for treatment/growth Supportive family/friends   PATIENT IDENTIFIED PROBLEMS: Depression  Substance abuse   Anxiety  "I just want to be able to talk to someone"  "Have more self control with medications"             DISCHARGE CRITERIA:  Improved stabilization in mood, thinking, and/or behavior Verbal commitment to aftercare and medication compliance Withdrawal symptoms are absent or subacute and managed without 24-hour nursing intervention  PRELIMINARY DISCHARGE PLAN: Outpatient therapy Medication management  PATIENT/FAMILY INVOLVEMENT: This treatment plan has been presented to and reviewed with the patient, Cathy Barker.  The patient and family have been given the opportunity to ask questions and make suggestions.  Levin BaconHeather V Sasha Rueth, RN 01/08/2017, 1:22 AM

## 2017-01-08 NOTE — Plan of Care (Signed)
Problem: Safety: Goal: Periods of time without injury will increase Outcome: Progressing Patient is on q15 minute safety checks and contracts for safety on the unit.   

## 2017-01-08 NOTE — [facility] Group Notes (Signed)
[facility] LCSW Group Therapy  01/08/2017 3:19 PM  Type of Therapy:  Group Therapy  Participation Level:  Did Not Attend-pt invited. Chose to remain in bed.   Summary of Progress/Problems: Emotion Regulation: This group focused on both positive and negative emotion identification and allowed group members to process ways to identify feelings, regulate negative emotions, and find healthy ways to manage internal/external emotions. Group members were asked to reflect on a time when their reaction to an emotion led to a negative outcome and explored how alternative responses using emotion regulation would have benefited them. Group members were also asked to discuss a time when emotion regulation was utilized when a negative emotion was experienced.   Jadee Golebiewski N Smart LCSW 01/08/2017, 3:19 PM

## 2017-01-08 NOTE — Progress Notes (Addendum)
EKG completed and on chart. Reviewed by Damien FusiA Nwoko, NP. No evidence of prolonged QT (pt has hx of).
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
## 2017-01-08 NOTE — Progress Notes (Signed)
D: Spoke with patient 1:1 who has been in bed throughout the shift. Patient reports significant withdrawal - N/V, diarrhea, anxiety, headache, muscle spasms, restlessness. Patient's affect anxious with congruent mood. States goal for today is to "feel better." Frequently asking for ativan as she states the librium is not helping. "The other hospital gave me ativan and I didn't feel this bad." Reports back pain of an 8/10, headache of a 4/10.   A: Medicated per orders, prn tylenol, zofran, loperamide, robaxin, advil and vistaril given. Emotional support offered and self inventory encouraged. Encouraged completion of Suicide Safety Plan once patient is feeling better. Discussed POC with MD, SW.   R: Patient verbalizes understanding of POC. On reassess, patient reports some relief from withdrawal symptoms however pain continues. "If I'm going to feel this bad, maybe I should just detox at home." Patient refusing self inventory completion at this time (even with staff assist offered.)  Patient denies SI/HI and remains safe on level III obs.

## 2017-01-08 NOTE — Tx Team (Signed)
Interdisciplinary Treatment and Diagnostic Plan Update  01/08/2017 Time of Session: 0930 Cathy Barker MRN: [id number]  Principal Diagnosis: Major Depressive Disorder, Severe  Secondary Diagnoses: Active Problems:   Substance induced mood disorder (HCC)   Current Medications:  Current Facility-Administered Medications  Medication Dose Route Frequency Provider Last Rate Last Dose  . acetaminophen (TYLENOL) tablet 650 mg  650 mg Oral Q6H PRN Truman Hayward, FNP   650 mg at 01/07/17 2253  . alum & mag hydroxide-simeth (MAALOX/MYLANTA) 200-200-20 MG/5ML suspension 30 mL  30 mL Oral Q4H PRN Truman Hayward, FNP      . chlordiazePOXIDE (LIBRIUM) capsule 25 mg  25 mg Oral Q6H PRN Truman Hayward, FNP   25 mg at 01/07/17 2253  . chlordiazePOXIDE (LIBRIUM) capsule 25 mg  25 mg Oral QID Truman Hayward, FNP   25 mg at 01/08/17 0853   Followed by  . [START ON 01/09/2017] chlordiazePOXIDE (LIBRIUM) capsule 25 mg  25 mg Oral TID Truman Hayward, FNP       Followed by  . [START ON 01/10/2017] chlordiazePOXIDE (LIBRIUM) capsule 25 mg  25 mg Oral BH-qamhs Starkes, Juel Burrow, FNP       Followed by  . [START ON 01/11/2017] chlordiazePOXIDE (LIBRIUM) capsule 25 mg  25 mg Oral Daily Starkes, Juel Burrow, FNP      . DULoxetine (CYMBALTA) DR capsule 30 mg  30 mg Oral Daily Truman Hayward, FNP   30 mg at 01/08/17 0853  . hydrOXYzine (ATARAX/VISTARIL) tablet 25 mg  25 mg Oral TID PRN Truman Hayward, FNP      . magnesium hydroxide (MILK OF MAGNESIA) suspension 30 mL  30 mL Oral Daily PRN Starkes, Takia S, FNP      . multivitamin with minerals tablet 1 tablet  1 tablet Oral Daily Truman Hayward, FNP   1 tablet at 01/08/17 0853  . nicotine (NICODERM CQ - dosed in mg/24 hours) patch 21 mg  21 mg Transdermal Q0600 Starkes, Takia S, FNP      . thiamine (B-1) injection 100 mg  100 mg Intramuscular Once Starkes, Takia S, FNP      . thiamine (VITAMIN B-1) tablet 100 mg  100 mg Oral Daily Malachy Chamber S, FNP    100 mg at 01/08/17 0853  . traZODone (DESYREL) tablet 50 mg  50 mg Oral QHS,MR X 1 Starkes, Takia S, FNP       PTA Medications: Prescriptions Prior to Admission  Medication Sig Dispense Refill Last Dose  . ALPRAZolam (XANAX) 1 MG tablet Take 1 mg by mouth 3 (three) times daily.   06/26/2016 at Unknown time  . chlordiazePOXIDE (LIBRIUM) 25 MG capsule Take 1 tablet 4 times a day (every 6 hours) for 1 day, then take 1 tablet three times a day (every 8 hours) for 1 day, then take 1 tablet twice a day for 1 day, then take 1 tablet once a day for 1 day. 10 capsule 0   . DULoxetine (CYMBALTA) 30 MG capsule Take 1 capsule (30 mg total) by mouth daily. 30 capsule 0   . folic acid (FOLVITE) 1 MG tablet Take 1 tablet (1 mg total) by mouth daily. 30 tablet 0   . furosemide (LASIX) 40 MG tablet Take 1 tablet (40 mg total) by mouth daily. 30 tablet 0   . HYDROcodone-acetaminophen (NORCO) 10-325 MG tablet Take 1 tablet by mouth every 6 (six) hours as needed for moderate pain.   06/26/2016 at Unknown  time  . naphazoline-pheniramine (NAPHCON-A) 0.025-0.3 % ophthalmic solution Place 1 drop into both eyes daily as needed for irritation.   06/26/2016 at Unknown time  . potassium chloride (K-DUR) 10 MEQ tablet Take 2 tablets (20 mEq total) by mouth daily. 60 tablet 0   . thiamine 100 MG tablet Take 1 tablet (100 mg total) by mouth daily. 30 tablet 0   . traMADol (ULTRAM) 50 MG tablet Take 50 mg by mouth every 6 (six) hours as needed for moderate pain.   06/25/2016 at Unknown time  . traZODone (DESYREL) 50 MG tablet Take 1 tablet (50 mg total) by mouth at bedtime as needed for sleep. 30 tablet 0     Patient Stressors: Financial difficulties Substance abuse  Patient Strengths: Dentist for treatment/growth Supportive family/friends  Treatment Modalities: Medication Management, Group therapy, Case management,  1 to 1 session with clinician, Psychoeducation,  Recreational therapy.   Physician Treatment Plan for Primary Diagnosis: Major Depressive Disorder, Severe  Medication Management: Evaluate patient's response, side effects, and tolerance of medication regimen.  Therapeutic Interventions: 1 to 1 sessions, Unit Group sessions and Medication administration.  Evaluation of Outcomes: Progressing  Physician Treatment Plan for Secondary Diagnosis: Active Problems:   Substance induced mood disorder (HCC)  Long Term Goal(s):     Short Term Goals:       Medication Management: Evaluate patient's response, side effects, and tolerance of medication regimen.  Therapeutic Interventions: 1 to 1 sessions, Unit Group sessions and Medication administration.  Evaluation of Outcomes: Progressing   RN Treatment Plan for Primary Diagnosis: Major Depressive Disorder, Severe Long Term Goal(s): Knowledge of disease and therapeutic regimen to maintain health will improve  Short Term Goals: Ability to remain free from injury will improve, Ability to verbalize feelings will improve and Ability to disclose and discuss suicidal ideas  Medication Management: RN will administer medications as ordered by provider, will assess and evaluate patient's response and provide education to patient for prescribed medication. RN will report any adverse and/or side effects to prescribing provider.  Therapeutic Interventions: 1 on 1 counseling sessions, Psychoeducation, Medication administration, Evaluate responses to treatment, Monitor vital signs and CBGs as ordered, Perform/monitor CIWA, COWS, AIMS and Fall Risk screenings as ordered, Perform wound care treatments as ordered.  Evaluation of Outcomes: Progressing   LCSW Treatment Plan for Primary Diagnosis:Major Depressive Disorder, Severe Long Term Goal(s): Safe transition to appropriate next level of care at discharge, Engage patient in therapeutic group addressing interpersonal concerns.  Short Term Goals: Engage  patient in aftercare planning with referrals and resources, Facilitate patient progression through stages of change regarding substance use diagnoses and concerns and Identify triggers associated with mental health/substance abuse issues  Therapeutic Interventions: Assess for all discharge needs, 1 to 1 time with Social worker, Explore available resources and support systems, Assess for adequacy in community support network, Educate family and significant other(s) on suicide prevention, Complete Psychosocial Assessment, Interpersonal group therapy.  Evaluation of Outcomes: Progressing   Progress in Treatment: Attending groups: Yes. Participating in groups: Yes. Taking medication as prescribed: Yes. Toleration medication: Yes. Family/Significant other contact made: No, will contact:  family member/husband if patient consents Patient understands diagnosis: Yes. Discussing patient identified problems/goals with staff: Yes. Medical problems stabilized or resolved: Yes. Denies suicidal/homicidal ideation: Yes. Issues/concerns per patient self-inventory: No. Other: n/a   New problem(s) identified: No, Describe:  n/a  New Short Term/Long Term Goal(s): detox; medication stabilization, decrease in depressive/anxiety symptoms, and development of comprehensive  mental wellness/sobriety plan. PT reports that she has been abusing alcohol, benzos, and opioids.   Discharge Plan or Barriers: CSW assessing for appropriate referrals. Pt was going to her PCP for xanax which was discontinued. No mental health providers per patient. She lives with her husband and 2 children.   Reason for Continuation of Hospitalization: Anxiety Depression Medication stabilization Suicidal ideation Withdrawal symptoms  Estimated Length of Stay: 3-5 days   Attendees: Patient: 01/08/2017 8:56 AM  Physician: Dr. Jackquline BerlinIzediuno MD 01/08/2017 8:56 AM  Nursing: Jacques NavyMarian, Jane RN 01/08/2017 8:56 AM  RN Care Manager: Onnie BoerJennifer Clark CM  01/08/2017 8:56 AM  Social Worker: Trula SladeHeather Smart, LCSW 01/08/2017 8:56 AM  Recreational Therapist: x 01/08/2017 8:56 AM  Other: Armandina StammerAgnes Nwoko NP 01/08/2017 8:56 AM  Other:  01/08/2017 8:56 AM  Other: 01/08/2017 8:56 AM    Scribe for Treatment Team: Ledell PeoplesHeather N Smart, LCSW 01/08/2017 8:56 AM

## 2017-01-08 NOTE — Progress Notes (Signed)
Informed patient EKG needs to be completed however patient refused at this time. "Let me rest and I should be able to after lunch." Will attempt then.

## 2017-01-08 NOTE — Progress Notes (Signed)
Recreation Therapy Notes  Date: 01/08/17 Time: 0930 Location: [address] Dayroom  Group Topic: Stress Management  Goal Area(s) Addresses:  Patient will verbalize importance of using healthy stress management.  Patient will identify positive emotions associated with healthy stress management.   Intervention: Stress Management  Activity :  Progressive Muscle Relaxation.  LRT introduced the stress management technique of progressive muscle relaxation.  LRT read a script to allow patients to participate in PMR which allowed them to tense and relax each muscle group individually.  Patients were to follow along with the script to fully engage in the activity.  Education:  Stress Management, Discharge Planning.   Education Outcome: Acknowledges edcuation/In group clarification offered/Needs additional education  Clinical Observations/Feedback: Pt did not attend group.   Rossetta Kama, LRT/CTRS         Caro Brundidge A 01/08/2017 11:43 AM 

## 2017-01-08 NOTE — Progress Notes (Signed)
Cathy RotaStephane is a 46 year old female being admitted voluntarily to [address].  She came to the ED reporting excessive alcohol and drug usage as well as depression and anxiety.  She denied SI/HI or A/V hallucinations.  She reported that she was getting pain medications and xanax from her medical doctor but MD d/c'd the xanax because of her excessive alcohol usage.  She reported drinking 1/5th of liquor daily and taking pain pills as well.  She denied any medical issues and appears to be in no physical distress.  Oriented her to the unit.  Admission paperwork completed and signed.  Belongings searched and secured in locker # 10.  Skin assessment completed and noted bruise on her left lower back and red/rash area under both breasts.  Q 15 minute checks initiated for safety.  We will monitor the progress towards her goals.
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
## 2017-01-08 NOTE — Progress Notes (Signed)
Nursing Progress Note [id number]  D) Patient presents anxious but is cooperative with Clinical research associatewriter. Patient complains of withdrawal symptoms and states to writer "I am going to have an anxiety attack if I don't get ahold of my husband. His mom died in an accident and I can't help but worry". Patient denies SI/HI/AVH but endorses a mild headache. Patient contracts for safety on the unit.  A)  Emotional support given. 1:1 interaction and active listening provided. Patient medicated as prescribed. Medications and plan of care reviewed with patient. Patient verbalized understanding without further questions. Snacks and fluids provided. Opportunities for questions or concerns presented to patient. Patient encouraged to continue to work on treatment goals. Labs, vital signs and patient behavior monitored throughout shift. Patient safety maintained with q15 min safety checks. Low fall risk precautions in place and reviewed with patient; patient verbalized understanding.  R) Patient receptive to interaction with nurse. Patient remains safe on the unit at this time. Patient denies any adverse medication reactions at this time. Patient adjusting to the unit without issue or concerns. Will continue to monitor.

## 2017-01-09 LAB — PROLACTIN: PROLACTIN: 34 ng/mL — AB (ref 4.8–23.3)

## 2017-01-09 LAB — HEMOGLOBIN A1C
HEMOGLOBIN A1C: 5.1 % (ref 4.8–5.6)
MEAN PLASMA GLUCOSE: 100 mg/dL

## 2017-01-09 MED ORDER — POTASSIUM CHLORIDE CRYS ER 20 MEQ PO TBCR
20.0000 meq | EXTENDED_RELEASE_TABLET | Freq: Every day | ORAL | Status: DC
  Administered 2017-01-09 – 2017-01-13 (×5): 20 meq via ORAL
  Filled 2017-01-09 (×8): qty 1
  Filled 2017-01-09: qty 7

## 2017-01-09 NOTE — Progress Notes (Signed)
[facility] MD Progress Note  01/09/2017 2:46 PM Cathy Barker  MRN:  [id number] Subjective:   46 yo Caucasian female, married, lives with her family. Referred from [facility]. Presented there via emergency services. Background history of SUD (alcohol, benzodiazepines, opiates), Mood and anxiety disorder. Patient was reported to have fell at home. Her husband called emergency services. She was intoxicated with alcohol at presentation. Patient transferred here to seek help with detox.  Chart reviewed today. Patient discussed at team  Staff reports that she has been very somatically focused. She is complaining of diarrhea. Feels her feet is swollen. Says she has been on Fruseamide at home. She has not voiced any hallucinations. She has not voiced any thoughts of suicide. She has not shown any aggressive behavior. Vital signs has been stable. CIWA scores has been within safe range.  Seen today. In bed. Says she feels slightly better.  No perceptual abnormality. No paranoia or persecution. Reports feeling sweaty and having diarrhea. No retching or vomiting. No confusion. No thoughts of violence. No suicidal thoughts. She reports being tired and just wants to stay in bed. Says she did attend NA group yesterday.  Encouraged that she would get better gradually  Principal Problem: Substance Use Disorder Diagnosis:   Patient Active Problem List   Diagnosis Date Noted  . Substance induced mood disorder (HCC) [F19.94] 01/07/2017  . Alcohol withdrawal (HCC) [F10.239] 06/28/2016  . Hypokalemia [E87.6] 06/26/2016  . Syncope and collapse [R55] 06/26/2016  . Prolonged QT interval [R94.31] 06/26/2016  . Anxiety [F41.9] 06/26/2016  . Alcohol abuse [F10.10] 06/26/2016   Total Time spent with patient: 20 minutes  Past Psychiatric History: As in H&P  Past Medical History:  Past Medical History:  Diagnosis Date  . Anxiety   . Depression     Past Surgical History:  Procedure Laterality Date  . NO PAST  SURGERIES     Family History:  Family History  Problem Relation Age of Onset  . Diabetes Mother   . Diabetes Father   . Lymphoma Father   . CAD Father   . Diabetes Other   . Cancer Other   . CAD Other    Family Psychiatric  History: As in H&P Social History:  History  Alcohol Use  . Yes    Comment: she does binge drinking     History  Drug Use    Social History   Social History  . Marital status: Married    Spouse name: N/A  . Number of children: N/A  . Years of education: N/A   Social History Main Topics  . Smoking status: Current Every Day Smoker    Packs/day: 1.50    Types: Cigarettes  . Smokeless tobacco: Never Used  . Alcohol use Yes     Comment: she does binge drinking  . Drug use: Yes  . Sexual activity: Yes    Birth control/ protection: None   Other Topics Concern  . None   Social History Narrative  . None   Additional Social History:    Pain Medications: see MAR Prescriptions: see MAR Over the Counter: see MAR       Sleep: Fair  Appetite:  Fair  Current Medications: Current Facility-Administered Medications  Medication Dose Route Frequency Provider Last Rate Last Dose  . acetaminophen (TYLENOL) tablet 650 mg  650 mg Oral Q6H PRN Truman Hayward, FNP   650 mg at 01/08/17 0856  . alum & mag hydroxide-simeth (MAALOX/MYLANTA) 200-200-20 MG/5ML suspension 30 mL  30 mL Oral Q4H PRN Truman HaywardStarkes, Takia S, FNP      . chlordiazePOXIDE (LIBRIUM) capsule 25 mg  25 mg Oral Q6H PRN Truman HaywardStarkes, Takia S, FNP   25 mg at 01/07/17 2253  . chlordiazePOXIDE (LIBRIUM) capsule 25 mg  25 mg Oral TID Truman HaywardStarkes, Takia S, FNP   25 mg at 01/09/17 1208   Followed by  . [START ON 01/10/2017] chlordiazePOXIDE (LIBRIUM) capsule 25 mg  25 mg Oral BH-qamhs Starkes, Juel Burrowakia S, FNP       Followed by  . [START ON 01/11/2017] chlordiazePOXIDE (LIBRIUM) capsule 25 mg  25 mg Oral Daily Starkes, Takia S, FNP      . cloNIDine (CATAPRES) tablet 0.1 mg  0.1 mg Oral QID Armandina StammerNwoko, Agnes I, NP   0.1  mg at 01/09/17 1208   Followed by  . [START ON 01/10/2017] cloNIDine (CATAPRES) tablet 0.1 mg  0.1 mg Oral BH-qamhs Nwoko, Agnes I, NP       Followed by  . [START ON 01/12/2017] cloNIDine (CATAPRES) tablet 0.1 mg  0.1 mg Oral QAC breakfast Nwoko, Agnes I, NP      . dicyclomine (BENTYL) tablet 20 mg  20 mg Oral Q6H PRN Armandina StammerNwoko, Agnes I, NP   20 mg at 01/09/17 1209  . DULoxetine (CYMBALTA) DR capsule 30 mg  30 mg Oral Daily Truman HaywardStarkes, Takia S, FNP   30 mg at 01/09/17 0831  . gabapentin (NEURONTIN) tablet 600 mg  600 mg Oral TID Georgiann CockerIzediuno, Vincent A, MD   600 mg at 01/09/17 1208  . hydrOXYzine (ATARAX/VISTARIL) tablet 25 mg  25 mg Oral TID PRN Truman HaywardStarkes, Takia S, FNP   25 mg at 01/08/17 1526  . ibuprofen (ADVIL,MOTRIN) tablet 600 mg  600 mg Oral Q6H PRN Armandina StammerNwoko, Agnes I, NP   600 mg at 01/09/17 0834  . loperamide (IMODIUM) capsule 2-4 mg  2-4 mg Oral PRN Armandina StammerNwoko, Agnes I, NP   2 mg at 01/09/17 1209  . magnesium hydroxide (MILK OF MAGNESIA) suspension 30 mL  30 mL Oral Daily PRN Starkes, Takia S, FNP      . methocarbamol (ROBAXIN) tablet 500 mg  500 mg Oral Q8H PRN Armandina StammerNwoko, Agnes I, NP   500 mg at 01/08/17 2218  . multivitamin with minerals tablet 1 tablet  1 tablet Oral Daily Truman HaywardStarkes, Takia S, FNP   1 tablet at 01/09/17 0831  . nicotine (NICODERM CQ - dosed in mg/24 hours) patch 21 mg  21 mg Transdermal Q0600 Truman HaywardStarkes, Takia S, FNP   21 mg at 01/09/17 [id number]  . ondansetron (ZOFRAN-ODT) disintegrating tablet 4 mg  4 mg Oral Q8H PRN Armandina StammerNwoko, Agnes I, NP   4 mg at 01/09/17 0629  . potassium chloride SA (K-DUR,KLOR-CON) CR tablet 20 mEq  20 mEq Oral Daily Izediuno, Delight OvensVincent A, MD   20 mEq at 01/09/17 1207  . thiamine (B-1) injection 100 mg  100 mg Intramuscular Once Starkes, Takia S, FNP      . thiamine (VITAMIN B-1) tablet 100 mg  100 mg Oral Daily Malachy ChamberStarkes, Takia S, FNP   100 mg at 01/09/17 0831  . traZODone (DESYREL) tablet 50 mg  50 mg Oral QHS,MR X 1 Starkes, Takia S, FNP   50 mg at 01/08/17 2115    Lab Results:   Results for orders placed or performed during the hospital encounter of 01/07/17 (from the past 48 hour(s))  Hemoglobin A1c     Status: None   Collection Time: 01/08/17  6:26 AM  Result Value  Ref Range   Hgb A1c MFr Bld 5.1 4.8 - 5.6 %    Comment: (NOTE)         Pre-diabetes: 5.7 - 6.4         Diabetes: >6.4         Glycemic control for adults with diabetes: <7.0    Mean Plasma Glucose 100 mg/dL    Comment: (NOTE) Performed At: [facility] [address] [place], [place] [id number] Mila Homer MD WU:[id number] Performed at [facility], [address], [place], [place] [address]   Lipid panel     Status: Abnormal   Collection Time: 01/08/17  6:26 AM  Result Value Ref Range   Cholesterol 237 (H) 0 - 200 mg/dL   Triglycerides 308 (H) <150 mg/dL   HDL 94 >65 mg/dL   Total CHOL/HDL Ratio 2.5 RATIO   VLDL 34 0 - 40 mg/dL   LDL Cholesterol 784 (H) 0 - 99 mg/dL    Comment:        Total Cholesterol/HDL:CHD Risk Coronary Heart Disease Risk Table                     Men   Women  1/2 Average Risk   3.4   3.3  Average Risk       5.0   4.4  2 X Average Risk   9.6   7.1  3 X Average Risk  23.4   11.0        Use the calculated Patient Ratio above and the CHD Risk Table to determine the patient's CHD Risk.        ATP III CLASSIFICATION (LDL):  <100     mg/dL   Optimal  696-295  mg/dL   Near or Above                    Optimal  130-159  mg/dL   Borderline  284-132  mg/dL   High  >440     mg/dL   Very High Performed at [facility], [address], [place], [place] [address]   TSH     Status: None   Collection Time: 01/08/17  6:26 AM  Result Value Ref Range   TSH 1.529 0.350 - 4.500 uIU/mL    Comment: Performed by a 3rd Generation assay with a functional sensitivity of <=0.01 uIU/mL. Performed at [facility], [address], [place], [place] [address]   Prolactin     Status: Abnormal   Collection Time: 01/08/17  6:26  AM  Result Value Ref Range   Prolactin 34.0 (H) 4.8 - 23.3 ng/mL    Comment: (NOTE) Performed At: [facility] [address] [place], [place] [id number] Mila Homer MD DG:[id number] Performed at [facility], [address], [place], [place] [address]     Blood Alcohol level:  No results found for: [facility]  Metabolic Disorder Labs: Lab Results  Component Value Date   HGBA1C 5.1 01/08/2017   MPG 100 01/08/2017   MPG 97 06/27/2016   Lab Results  Component Value Date   PROLACTIN 34.0 (H) 01/08/2017   Lab Results  Component Value Date   CHOL 237 (H) 01/08/2017   TRIG 172 (H) 01/08/2017   HDL 94 01/08/2017   CHOLHDL 2.5 01/08/2017   VLDL 34 01/08/2017   LDLCALC 109 (H) 01/08/2017    Physical Findings: AIMS: Facial and Oral Movements Muscles of Facial Expression: None,  normal Lips and Perioral Area: None, normal Jaw: None, normal Tongue: None, normal,Extremity Movements Upper (arms, wrists, hands, fingers): None, normal Lower (legs, knees, ankles, toes): None, normal, Trunk Movements Neck, shoulders, hips: None, normal, Overall Severity Severity of abnormal movements (highest score from questions above): None, normal Incapacitation due to abnormal movements: None, normal Patient's awareness of abnormal movements (rate only patient's report): No Awareness, Dental Status Current problems with teeth and/or dentures?: Yes Does patient usually wear dentures?: No  CIWA:  CIWA-Ar Total: 5 COWS:  COWS Total Score: 1  Musculoskeletal: Strength & Muscle Tone: within normal limits Gait & Station: broad based Patient leans: N/A  Psychiatric Specialty Exam: Physical Exam  Constitutional: She is oriented to person, place, and time. No distress.  HENT:  Head: Normocephalic.  Eyes: Pupils are equal, round, and reactive to light.  Neck: Neck supple.  Cardiovascular: Normal rate.   Musculoskeletal: She exhibits no edema.  Neurological: She is  alert and oriented to person, place, and time.  Psychiatric:  As above    ROS  Blood pressure 101/75, pulse 77, temperature 98.5 F (36.9 C), temperature source Oral, resp. rate 18, height 5\' 7"  (1.702 m), weight 99.3 kg (219 lb), SpO2 98 %.Body mass index is 34.3 kg/m.  General Appearance: In bed under the sheets. No tremors. Slightly sweaty. No confusion. Not internally stimulated.   Eye Contact:  Fair  Speech:  Clear and Coherent and Normal Rate  Volume:  Normal  Mood:  Better subjectively and objectively  Affect:  Congruent  Thought Process:  Linear  Orientation:  Full (Time, Place, and Person)  Thought Content:  Future oriented. No thoughts of violence. No hallucination.   Suicidal Thoughts:  No  Homicidal Thoughts:  No  Memory:  Immediate;   Good Recent;   Fair Remote;   Good  Judgement:  Fair  Insight:  Fair  Psychomotor Activity:  Decreased  Concentration:  Concentration: Fair and Attention Span: Fair  Recall:  [employer] of Knowledge:  Fair  Language:  Good  Akathisia:  Negative  Handed:    AIMS (if indicated):     Assets:  Communication Skills Desire for Improvement Financial Resources/Insurance Housing Intimacy Transportation Vocational/Educational  ADL's:  Fair  Cognition:  WNL  Sleep:  Number of Hours: 6     Treatment Plan Summary: Patient is coming off substances smoothly. She remains at risk of seizures. She still needs inpatient monitoring. No edema. No indication for Lasix at this time especially with concurrent hypokalemia.   Psychiatric: SUD Substance related anxiety and mood disorder  Medical: LBP Chronic pain Obesity  Psychosocial:   PLAN: 1. Continue current regimen 2. Serial BMP 3. Continue to monitor mood, behavior and interaction with peers.  4. Hopeful discharge by Sunday.   Georgiann Cocker, MD 01/09/2017, 2:46 PM

## 2017-01-09 NOTE — [facility] Group Notes (Signed)
[facility] LCSW Group Therapy  01/09/2017 3:40 PM  Type of Therapy:  Group Therapy  Participation Level:  Did Not Attend-pt invited. Chose to remain in bed.   Summary of Progress/Problems: Today's Topic: Overcoming Obstacles. Patients identified one short term goal and potential obstacles in reaching this goal. Patients processed barriers involved in overcoming these obstacles. Patients identified steps necessary for overcoming these obstacles and explored motivation (internal and external) for facing these difficulties head on.   Ahriyah Vannest N Smart LCSW 01/09/2017, 3:40 PM

## 2017-01-09 NOTE — Plan of Care (Signed)
Problem: Education: Goal: Verbalization of understanding the information provided will improve Outcome: Progressing Patient verbalizes understanding of information, education provided. Would benefit from re-education.  Problem: Coping: Goal: Ability to interact with others will improve Outcome: Not Progressing Patient refusing group, participation in milieu, interaction with peers. Remains in bed.

## 2017-01-09 NOTE — Progress Notes (Signed)
Patient remains isolative to bed. Frequent physical complaints, mostly withdrawal related. Reports nausea, diarrhea, anxiety, restlessness. Also continues to complain of back pain rating it an 8/10. Affect anxious with congruent mood. Patient asking about lasix as she takes it at home.   Medicated per orders, prn advil, imodium and bentyl given throughout the day. Fluids encouraged. Reviewed [facility] and patient's K+ 3.0. MD made aware. Orders received. Discussed POC with tx team. Fall prevention plan in place and reviewed with patient. Emotional support offered. Self inventory and Suicide Safety Plan completion encouraged. Fall prevention plan in place and reviewed.   R: Patient verbalizes understanding of POC, falls teaching. On reassess, patient asleep on and off. Patient denies SI/HI and remains safe on level III obs.

## 2017-01-09 NOTE — Progress Notes (Signed)
Pt has been back and forth to the nurse's station all evening with various requests and concerns.  She continues to c/o diarrhea and pain d/t a fall prior to admission while she was intoxicated.  Pt was medicated per detox protocol.  She denies SI/HI/AVH.  She has had numerous somatic complaints.  She reports that she was not properly evaluated [facility] the ED [facility].  She says she is still having significant pain to her side and has "imagined several issues" that the fall could have caused.  Pt is also concerned about edema that has developed throughout the day, and says it is so bad that she cannot wear her clothes.  She had her sister bring her some bigger clothes to wear.  Through conversation with pt, she reports that she takes Lasix at home, 40 mg BID in the AM and at noon.  She also mentioned that she had blood in her urine, but flushed it before staff could see it.  Pt was encouraged to allow staff to see her urine if she noticed any more blood.  Pt voiced understanding.  Pt was assured that this information would be passed to the nest shift to be assessed by a PA or NP.  Support and encouragement offered.  Discharge plans are in process.  Safety maintained with q15 minute checks.

## 2017-01-09 NOTE — [facility] Suicide Risk Assessment (Signed)
[facility] INPATIENT:  Family/Significant Other Suicide Prevention Education  Suicide Prevention Education:  Education Completed; Pernell DupreGlen Bade (pt's husband) [phone] has been identified by the patient as the family member/significant other with whom the patient will be residing, and identified as the person(s) who will aid the patient in the event of a mental health crisis (suicidal ideations/suicide attempt).  With written consent from the patient, the family member/significant other has been provided the following suicide prevention education, prior to the and/or following the discharge of the patient.  The suicide prevention education provided includes the following:  Suicide risk factors  Suicide prevention and interventions  National Suicide Hotline telephone number  [facility] assessment telephone number  [facility] Emergency Assistance 911  [facility] and/or Residential Mobile Crisis Unit telephone number  Request made of family/significant other to:  Remove weapons (e.g., guns, rifles, knives), all items previously/currently identified as safety concern.    Remove drugs/medications (over-the-counter, prescriptions, illicit drugs), all items previously/currently identified as a safety concern.  The family member/significant other verbalizes understanding of the suicide prevention education information provided.  The family member/significant other agrees to remove the items of safety concern listed above.  Chaynce Schafer N Smart LCSW 01/09/2017, 11:16 AM

## 2017-01-10 NOTE — [facility] Group Notes (Signed)
[facility] LCSW Group Therapy  01/10/2017 3:46 PM  Type of Therapy:  Group Therapy  Participation Level:  Did Not Attend-pt invited. Chose to remain in bed.   Summary of Progress/Problems: Feelings around Relapse. Group members discussed the meaning of relapse and shared personal stories of relapse, how it affected them and others, and how they perceived themselves during this time. Group members were encouraged to identify triggers, warning signs and coping skills used when facing the possibility of relapse. Social supports were discussed and explored in detail. Post Acute Withdrawal Syndrome (handout provided) was introduced and examined. Pt's were encouraged to ask questions, talk about key points associated with PAWS, and process this information in terms of relapse prevention.   Cathy Barker N Smart LCSW 01/10/2017, 3:46 PM  

## 2017-01-10 NOTE — Progress Notes (Signed)
Recreation Therapy Notes  Date: 01/10/17 Time: 0930 Location: [address] Dayroom  Group Topic: Stress Management  Goal Area(s) Addresses:  Patient will verbalize importance of using healthy stress management.  Patient will identify positive emotions associated with healthy stress management.   Intervention: Stress Management  Activity :  Guided Imagery.  LRT introduced the stress management technique of guided imagery.  LRT read a script to allow patients to take a mental vacation to escape their current surroundings.  Patients were to follow along as the script was read to fully engage in the activity.  Education:  Stress Management, Discharge Planning.   Education Outcome: Acknowledges edcuation/In group clarification offered/Needs additional education  Clinical Observations/Feedback: Pt did not attend group.   Caroll RancherMarjette Chaeli Judy, LRT/CTRS         Lillia AbedLindsay, Janney Priego A 01/10/2017 11:21 AM

## 2017-01-10 NOTE — Progress Notes (Signed)
Patient did not attend AA group meeting. 

## 2017-01-10 NOTE — Progress Notes (Signed)
D: Patient denies SI/HI or AVH. Patient appears to have an anxious mood and animated affect. She is fixated on medication and what she can receive as PRN doses.  Pt. Discussed her substance abuse, how it began and progressed.  She states that her withdrawals are more limited to anxiety and pain.  She states that she has not slept well, encouraged patient remain awake today so she can discuss with the physician.    A: Patient given emotional support from RN. Patient encouraged to come to staff with concerns and/or questions. Patient's medication routine continued. Patient's orders and plan of care reviewed.   R: Patient remains appropriate and cooperative. Will continue to monitor patient q15 minutes for safety.

## 2017-01-10 NOTE — Progress Notes (Signed)
Pt was up at shift change and visiting with her husband.  Pt continues to have somatic complaints and frequent requests for prn medications.  She received several prns after returning from dinner prior to shift change.  After her husband left, she came to the nurse's station asking what else she could have.  She continues to c/o diarrhea.and nausea, although she asked around 2130 about getting her tray from dinner that she said staff had put up for her.  Writer got her a dinner tray, and she ate a portion of it.  She denies SI/HI/AVH.  She reports that she is still having significant withdrawal symptoms.  Support and encouragement offered.  Pt medicated as ordered.  Discharge plans are in process.  Safety maintained with q15 minute checks.

## 2017-01-10 NOTE — Progress Notes (Signed)
[facility] MD Progress Note  01/10/2017 4:32 PM Cathy Barker  MRN:  [id number] Subjective:   46 yo Caucasian female, married, lives with her family. Referred from [facility]. Presented there via emergency services. Background history of SUD (alcohol, benzodiazepines, opiates), Mood and anxiety disorder. Patient was reported to have fell at home. Her husband called emergency services. She was intoxicated with alcohol at presentation. Patient transferred here to seek help with detox.  Chart reviewed today. Patient discussed at team  Staff reports that she coming out a bit more. Somatically focused and minimizes her addiction. Did not attend any groups lately.   Seen today. Out to get PRN earlier today. Dismissive of her addiction. Very focused on somatic complaints. No perceptual abnormality. No suicidal or homicidal thoughts.   Principal Problem: Substance Use Disorder Diagnosis:   Patient Active Problem List   Diagnosis Date Noted  . Substance induced mood disorder (HCC) [F19.94] 01/07/2017  . Alcohol withdrawal (HCC) [F10.239] 06/28/2016  . Hypokalemia [E87.6] 06/26/2016  . Syncope and collapse [R55] 06/26/2016  . Prolonged QT interval [R94.31] 06/26/2016  . Anxiety [F41.9] 06/26/2016  . Alcohol abuse [F10.10] 06/26/2016   Total Time spent with patient: 20 minutes  Past Psychiatric History: As in H&P  Past Medical History:  Past Medical History:  Diagnosis Date  . Anxiety   . Depression     Past Surgical History:  Procedure Laterality Date  . NO PAST SURGERIES     Family History:  Family History  Problem Relation Age of Onset  . Diabetes Mother   . Diabetes Father   . Lymphoma Father   . CAD Father   . Diabetes Other   . Cancer Other   . CAD Other    Family Psychiatric  History: As in H&P Social History:  History  Alcohol Use  . Yes    Comment: she does binge drinking     History  Drug Use    Social History   Social History  . Marital status: Married     Spouse name: N/A  . Number of children: N/A  . Years of education: N/A   Social History Main Topics  . Smoking status: Current Every Day Smoker    Packs/day: 1.50    Types: Cigarettes  . Smokeless tobacco: Never Used  . Alcohol use Yes     Comment: she does binge drinking  . Drug use: Yes  . Sexual activity: Yes    Birth control/ protection: None   Other Topics Concern  . None   Social History Narrative  . None   Additional Social History:    Pain Medications: see MAR Prescriptions: see MAR Over the Counter: see MAR       Sleep: Fair  Appetite:  Fair  Current Medications: Current Facility-Administered Medications  Medication Dose Route Frequency Provider Last Rate Last Dose  . acetaminophen (TYLENOL) tablet 650 mg  650 mg Oral Q6H PRN Truman Hayward, FNP   650 mg at 01/08/17 0856  . alum & mag hydroxide-simeth (MAALOX/MYLANTA) 200-200-20 MG/5ML suspension 30 mL  30 mL Oral Q4H PRN Truman Hayward, FNP      . chlordiazePOXIDE (LIBRIUM) capsule 25 mg  25 mg Oral Q6H PRN Truman Hayward, FNP   25 mg at 01/10/17 0153  . chlordiazePOXIDE (LIBRIUM) capsule 25 mg  25 mg Oral BH-qamhs Truman Hayward, FNP   25 mg at 01/10/17 4098   Followed by  . [START ON 01/11/2017] chlordiazePOXIDE (LIBRIUM) capsule 25  mg  25 mg Oral Daily Starkes, Takia S, FNP      . cloNIDine (CATAPRES) tablet 0.1 mg  0.1 mg Oral BH-qamhs Nwoko, Agnes I, NP   0.1 mg at 01/10/17 0929   Followed by  . [START ON 01/12/2017] cloNIDine (CATAPRES) tablet 0.1 mg  0.1 mg Oral QAC breakfast Nwoko, Agnes I, NP      . dicyclomine (BENTYL) tablet 20 mg  20 mg Oral Q6H PRN Armandina StammerNwoko, Agnes I, NP   20 mg at 01/09/17 2008  . DULoxetine (CYMBALTA) DR capsule 30 mg  30 mg Oral Daily Truman HaywardStarkes, Takia S, FNP   30 mg at 01/10/17 [id number]  . gabapentin (NEURONTIN) tablet 600 mg  600 mg Oral TID Georgiann CockerIzediuno, Yazid Pop A, MD   600 mg at 01/10/17 1415  . hydrOXYzine (ATARAX/VISTARIL) tablet 25 mg  25 mg Oral TID PRN Truman HaywardStarkes, Takia S, FNP    25 mg at 01/08/17 1526  . ibuprofen (ADVIL,MOTRIN) tablet 600 mg  600 mg Oral Q6H PRN Armandina StammerNwoko, Agnes I, NP   600 mg at 01/10/17 0934  . loperamide (IMODIUM) capsule 2-4 mg  2-4 mg Oral PRN Armandina StammerNwoko, Agnes I, NP   2 mg at 01/10/17 1417  . magnesium hydroxide (MILK OF MAGNESIA) suspension 30 mL  30 mL Oral Daily PRN Starkes, Takia S, FNP      . methocarbamol (ROBAXIN) tablet 500 mg  500 mg Oral Q8H PRN Armandina StammerNwoko, Agnes I, NP   500 mg at 01/10/17 1417  . multivitamin with minerals tablet 1 tablet  1 tablet Oral Daily Truman HaywardStarkes, Takia S, FNP   1 tablet at 01/10/17 0930  . nicotine (NICODERM CQ - dosed in mg/24 hours) patch 21 mg  21 mg Transdermal Q0600 Truman HaywardStarkes, Takia S, FNP   21 mg at 01/10/17 [id number]  . ondansetron (ZOFRAN-ODT) disintegrating tablet 4 mg  4 mg Oral Q8H PRN Armandina StammerNwoko, Agnes I, NP   4 mg at 01/10/17 0620  . potassium chloride SA (K-DUR,KLOR-CON) CR tablet 20 mEq  20 mEq Oral Daily Jamesia Linnen, Delight OvensVincent A, MD   20 mEq at 01/10/17 0928  . thiamine (B-1) injection 100 mg  100 mg Intramuscular Once Starkes, Takia S, FNP      . thiamine (VITAMIN B-1) tablet 100 mg  100 mg Oral Daily Truman HaywardStarkes, Takia S, FNP   100 mg at 01/10/17 0929  . traZODone (DESYREL) tablet 50 mg  50 mg Oral QHS,MR X 1 Starkes, Takia S, FNP   50 mg at 01/10/17 0153    Lab Results:  No results found for this or any previous visit (from the past 48 hour(s)).  Blood Alcohol level:  [facility]: [facility]  Metabolic Disorder Labs: Lab Results  Component Value Date   HGBA1C 5.1 01/08/2017   MPG 100 01/08/2017   MPG 97 06/27/2016   Lab Results  Component Value Date   PROLACTIN 34.0 (H) 01/08/2017   Lab Results  Component Value Date   CHOL 237 (H) 01/08/2017   TRIG 172 (H) 01/08/2017   HDL 94 01/08/2017   CHOLHDL 2.5 01/08/2017   VLDL 34 01/08/2017   LDLCALC 109 (H) 01/08/2017    Physical Findings: AIMS: Facial and Oral Movements Muscles of Facial Expression: None, normal Lips and Perioral Area: None, normal Jaw: None,  normal Tongue: None, normal,Extremity Movements Upper (arms, wrists, hands, fingers): None, normal Lower (legs, knees, ankles, toes): None, normal, Trunk Movements Neck, shoulders, hips: None, normal, Overall Severity Severity of abnormal movements (highest score from questions  above): None, normal Incapacitation due to abnormal movements: None, normal Patient's awareness of abnormal movements (rate only patient's report): No Awareness, Dental Status Current problems with teeth and/or dentures?: Yes Does patient usually wear dentures?: No  CIWA:  CIWA-Ar Total: 1 COWS:  COWS Total Score: 4  Musculoskeletal: Strength & Muscle Tone: within normal limits Gait & Station: broad based Patient leans: N/A  Psychiatric Specialty Exam: Physical Exam  Constitutional: She is oriented to person, place, and time. No distress.  HENT:  Head: Normocephalic.  Eyes: Pupils are equal, round, and reactive to light.  Neck: Neck supple.  Cardiovascular: Normal rate.   Musculoskeletal: She exhibits no edema.  Neurological: She is alert and oriented to person, place, and time.  Psychiatric:  As above    ROS  Blood pressure 117/74, pulse 99, temperature 98 F (36.7 C), temperature source Oral, resp. rate 16, height 5\' 7"  (1.702 m), weight 99.3 kg (219 lb), SpO2 98 %.Body mass index is 34.3 kg/m.  General Appearance: Casually dressed. No tremors. Not sweaty  Eye Contact:  Good  Speech:  Clear and Coherent and Normal Rate  Volume:  Normal  Mood:  Better subjectively and objectively  Affect:  Congruent  Thought Process:  Linear  Orientation:  Full (Time, Place, and Person)  Thought Content:  Future oriented. No thoughts of violence. No hallucination.   Suicidal Thoughts:  No  Homicidal Thoughts:  No  Memory:  Immediate;   Good Recent;   Fair Remote;   Good  Judgement:  Fair  Insight:  Fair  Psychomotor Activity:  Decreased  Concentration:  Concentration: Fair and Attention Span: Fair  Recall:   [employer] of Knowledge:  Fair  Language:  Good  Akathisia:  Negative  Handed:    AIMS (if indicated):     Assets:  Communication Skills Desire for Improvement Financial Resources/Insurance Housing Intimacy Transportation Vocational/Educational  ADL's:  Fair  Cognition:  WNL  Sleep:  Number of Hours: 4.5     Treatment Plan Summary: Patient is coming off substances smoothly. She remains at risk of seizures. She still needs inpatient monitoring. Hopeful discharge early next week.  Psychiatric: SUD Substance related anxiety and mood disorder  Medical: LBP Chronic pain Obesity  Psychosocial:   PLAN: 1. Continue current regimen 2. Continue to monitor mood, behavior and interaction with peers.    Georgiann Cocker, MD 01/10/2017, 4:32 PMPatient ID: Cathy Barker, female   DOB: 16-Mar-1971, 46 y.o.   MRN: [id number]

## 2017-01-11 MED ORDER — LIDOCAINE 5 % EX PTCH
1.0000 | MEDICATED_PATCH | CUTANEOUS | Status: DC
  Administered 2017-01-12 – 2017-01-13 (×2): 1 via TRANSDERMAL
  Filled 2017-01-11 (×4): qty 1

## 2017-01-11 MED ORDER — LIDOCAINE 5 % EX PTCH
1.0000 | MEDICATED_PATCH | CUTANEOUS | Status: DC
  Filled 2017-01-11: qty 1

## 2017-01-11 NOTE — Progress Notes (Signed)
D. Pt anxious on approach, complaint of anxiety and feeling as if she is about to have panic attack.  Pt given PRN medication and returned to her room to work on homework she was given on days.  Pt was able to come out later and eat snack/talk on phone.  Pt states husband has lupus and she feels guilty about leaving him while she is in here.  Pt denies SI/HI/hallucinations at this time.  A.  Support and encouragement offered, medication given as ordered.  R.  Pt remains safe on the unit, will continue to monitor.

## 2017-01-11 NOTE — Progress Notes (Addendum)
Data. Patient denies SI/HI/AVH. Patient interacting well with staff and most other patients.Patient reported, "I am really swollen. I haven't had my lasix. I have this huge stomach and my face doesn't usually look like this." Feet and abdomen assessed for ascites and pitting edema. None was observed, or palpated. MD notified of patient's complaint Patient also reported that another, female patient, "Has been saying stuff to me. He keeps writting me little notes and he says he is going to come into my room tonight." Patient was offered the option to move to another unit. Patient refused, stating, "I want to stay here. I like my room mate, and I just think he is playing." Patient did ask to be shown the call system in her room and was offered assurance that staff, have been, and would continue to observe the unit for safety. Patient has been almost hypomanic this shift. Speech has been rapid, pressured and loud. She often repeats information just given and she has also been demanding and intrusive at times.  Action. Emotional support and encouragement offered. Education provided on medication, indications and side effect. Q 15 minute checks done for safety. Response. Safety on the unit maintained through 15 minute checks.  Medications taken as prescribed. Attended groups. Remained calm and appropriate through out shift.

## 2017-01-11 NOTE — [facility] Group Notes (Addendum)
[facility] Group Notes:  (Clinical Social Work)   06/03/2015     10:00-11:00AM  Summary of Progress/Problems:   In today's process group a decisional balance exercise was used to explore in depth the perceived benefits and costs of alcohol and drugs, as well as the  benefits and costs of replacing these with healthy coping skills.  Patients listed healthy and unhealthy coping techniques, determining that unhealthy coping techniques work initially, but eventually become harmful.  Motivational Interviewing and the whiteboard were utilized for the exercises.  The patient expressed that the unhealthy coping she often uses is prescription medications.  She was somewhat difficult in group because she refuted everything that was said that was not personal to her own situation.  For instance, if someone talked about losing family/friend relationships through drug use, she seemed compelled to say that this did not apply to her because her family remains very supportive.  Pt showed little insight into the potential problems that can be caused by an addiction such as hers, since some of those consequences have not happened to her, and she showed little ability to emphathize with others in the group who have faced such consequences.  She frequently had side conversations as well, and had to be redirected numerous times.  Type of Therapy:  Group Therapy - Process   Participation Level:  Active  Participation Quality:  Intrusive and Monopolizing  Affect:  Blunted  Cognitive:  Disorganized  Insight:  Lacking  Engagement in Therapy:  Improving  Modes of Intervention:  Education, Motivational Interviewing  Ambrose MantleMareida Grossman-Orr, LCSW 01/11/2017, 3:42 PM

## 2017-01-11 NOTE — [facility] Group Notes (Signed)
[facility] Group Notes:  (Nursing/MHT/Case Management/Adjunct)  Date:  01/11/2017  Time:  1315  Type of Therapy:  Nurse Education  Participation Level:  Active  Participation Quality:  Attentive  Affect:  Anxious  Cognitive:  Alert  Insight:  Lacking  Engagement in Group:  Engaged  Modes of Intervention:  Discussion and Education  Summary of Progress/Problems: Nurse led exercise on improving, strengthening self esteem.  Lawrence MarseillesFriedman, Meiling Hendriks Eakes 01/11/2017, 2:29 PM

## 2017-01-11 NOTE — Progress Notes (Signed)
[facility]  01/11/2017 2:55 PM Cathy McalpineStephanie O. Barker  MRN:  [id number] Subjective:   46 yo Caucasian female, married, lives with her family. [facility]. Presented there via emergency services. Background history of SUD (alcohol, benzodiazepines, opiates), Mood and anxiety disorder. Patient was reported to have fell at home. Her husband called emergency services. She was intoxicated with alcohol at presentation. Patient transferred here to seek help with detox.  Chart reviewed today. Patient discussed at team  Staff reports that she is less somatically focused. Still seeks PRN's around the clock. Coming out more. Was at groups today. Has been appropriate. Not internally stimulated. Vital signs has been stable.   Seen today. Says she is beginning to feel better. Some insight into the role of opiates and benzodiazepines. Says she has some benefit from current medications for pain as she had developed tolerance to opioids over the years. Says her family came to visit her yesterday. They feel she is getting back to her old self. Patient does not want to do inpatient rehab. Says she would do groups and visit her outpatient psychiatrist. No futility thoughts. No suicidal or homicidal thoughts. No evidence of psychosis. No overwhelming anxiety.  Patient is focused on getting back on Lasix. No noticeable edema. Would start only 20 mg daily.  Principal Problem: Substance Use Disorder Diagnosis:   Patient Active Problem List   Diagnosis Date Noted  . Substance induced mood disorder (HCC) [F19.94] 01/07/2017  . Alcohol withdrawal (HCC) [F10.239] 06/28/2016  . Hypokalemia [E87.6] 06/26/2016  . Syncope and collapse [R55] 06/26/2016  . Prolonged QT interval [R94.31] 06/26/2016  . Anxiety [F41.9] 06/26/2016  . Alcohol abuse [F10.10] 06/26/2016   Total Time spent with patient: 20 minutes  Past Psychiatric History: As in H&P  Past Medical History:  Past Medical History:   Diagnosis Date  . Anxiety   . Depression     Past Surgical History:  Procedure Laterality Date  . NO PAST SURGERIES     Family History:  Family History  Problem Relation Age of Onset  . Diabetes Mother   . Diabetes Father   . Lymphoma Father   . CAD Father   . Diabetes Other   . Cancer Other   . CAD Other    Family Psychiatric  History: As in H&P Social History:  History  Alcohol Use  . Yes    Comment: she does binge drinking     History  Drug Use    Social History   Social History  . Marital status: Married    Spouse name: N/A  . Number of children: N/A  . Years of education: N/A   Social History Main Topics  . Smoking status: Current Every Day Smoker    Packs/day: 1.50    Types: Cigarettes  . Smokeless tobacco: Never Used  . Alcohol use Yes     Comment: she does binge drinking  . Drug use: Yes  . Sexual activity: Yes    Birth control/ protection: None   Other Topics Concern  . None   Social History Narrative  . None   Additional Social History:    Pain Medications: see MAR Prescriptions: see MAR Over the Counter: see MAR       Sleep: Good  Appetite:  Better  Current Medications: Current Facility-Administered Medications  Medication Dose Route Frequency Provider Last Rate Last Dose  . acetaminophen (TYLENOL) tablet 650 mg  650 mg Oral Q6H PRN Truman HaywardStarkes, Takia S, FNP  650 mg at 01/08/17 0856  . alum & mag hydroxide-simeth (MAALOX/MYLANTA) 200-200-20 MG/5ML suspension 30 mL  30 mL Oral Q4H PRN Starkes, Takia S, FNP      . cloNIDine (CATAPRES) tablet 0.1 mg  0.1 mg Oral BH-qamhs Nwoko, Agnes I, NP   0.1 mg at 01/11/17 0919   Followed by  . [START ON 01/12/2017] cloNIDine (CATAPRES) tablet 0.1 mg  0.1 mg Oral QAC breakfast Nwoko, Agnes I, NP      . dicyclomine (BENTYL) tablet 20 mg  20 mg Oral Q6H PRN Armandina Stammer I, NP   20 mg at 01/11/17 0918  . DULoxetine (CYMBALTA) DR capsule 30 mg  30 mg Oral Daily Truman Hayward, FNP   30 mg at 01/11/17  4098  . gabapentin (NEURONTIN) tablet 600 mg  600 mg Oral TID Georgiann Cocker, MD   600 mg at 01/11/17 1151  . hydrOXYzine (ATARAX/VISTARIL) tablet 25 mg  25 mg Oral TID PRN Truman Hayward, FNP   25 mg at 01/10/17 2305  . ibuprofen (ADVIL,MOTRIN) tablet 600 mg  600 mg Oral Q6H PRN Armandina Stammer I, NP   600 mg at 01/11/17 0709  . loperamide (IMODIUM) capsule 2-4 mg  2-4 mg Oral PRN Armandina Stammer I, NP   2 mg at 01/11/17 1151  . magnesium hydroxide (MILK OF MAGNESIA) suspension 30 mL  30 mL Oral Daily PRN Darcella Gasman, Takia S, FNP      . methocarbamol (ROBAXIN) tablet 500 mg  500 mg Oral Q8H PRN Armandina Stammer I, NP   500 mg at 01/10/17 2305  . multivitamin with minerals tablet 1 tablet  1 tablet Oral Daily Truman Hayward, FNP   1 tablet at 01/11/17 1191  . nicotine (NICODERM CQ - dosed in mg/24 hours) patch 21 mg  21 mg Transdermal Q0600 Truman Hayward, FNP   21 mg at 01/11/17 0711  . ondansetron (ZOFRAN-ODT) disintegrating tablet 4 mg  4 mg Oral Q8H PRN Armandina Stammer I, NP   4 mg at 01/11/17 1256  . potassium chloride SA (K-DUR,KLOR-CON) CR tablet 20 mEq  20 mEq Oral Daily Keshun Berrett, Delight Ovens, MD   20 mEq at 01/11/17 0919  . thiamine (B-1) injection 100 mg  100 mg Intramuscular Once Starkes, Takia S, FNP      . thiamine (VITAMIN B-1) tablet 100 mg  100 mg Oral Daily Truman Hayward, FNP   100 mg at 01/11/17 0918  . traZODone (DESYREL) tablet 50 mg  50 mg Oral QHS,MR X 1 Starkes, Takia S, FNP   50 mg at 01/11/17 0008    Lab Results:  No results found for this or any previous visit (from the past 48 hour(s)).  Blood Alcohol level:  No results found for: [facility]  Metabolic Disorder Labs: Lab Results  Component Value Date   HGBA1C 5.1 01/08/2017   MPG 100 01/08/2017   MPG 97 06/27/2016   Lab Results  Component Value Date   PROLACTIN 34.0 (H) 01/08/2017   Lab Results  Component Value Date   CHOL 237 (H) 01/08/2017   TRIG 172 (H) 01/08/2017   HDL 94 01/08/2017   CHOLHDL 2.5 01/08/2017    VLDL 34 01/08/2017   LDLCALC 109 (H) 01/08/2017    Physical Findings: AIMS: Facial and Oral Movements Muscles of Facial Expression: None, normal Lips and Perioral Area: None, normal Jaw: None, normal Tongue: None, normal,Extremity Movements Upper (arms, wrists, hands, fingers): None, normal Lower (legs, knees, ankles, toes): None, normal,  Trunk Movements Neck, shoulders, hips: None, normal, Overall Severity Severity of abnormal movements (highest score from questions above): None, normal Incapacitation due to abnormal movements: None, normal Patient's awareness of abnormal movements (rate only patient's report): No Awareness, Dental Status Current problems with teeth and/or dentures?: Yes Does patient usually wear dentures?: No  CIWA:  CIWA-Ar Total: 3 COWS:  COWS Total Score: 7  Musculoskeletal: Strength & Muscle Tone: within normal limits Gait & Station: broad based Patient leans: N/A  Psychiatric Specialty Exam: Physical Exam  Constitutional: She is oriented to person, place, and time. She appears well-developed and well-nourished.  HENT:  Head: Normocephalic.  Eyes: Pupils are equal, round, and reactive to light.  Neck: Normal range of motion. Neck supple.  Cardiovascular: Normal rate.   Musculoskeletal: She exhibits no edema.  Neurological: She is alert and oriented to person, place, and time.  Psychiatric:  As above    ROS  Blood pressure 108/67, pulse 89, temperature 98.2 F (36.8 C), temperature source Oral, resp. rate 16, height 5\' 7"  (1.702 m), weight 99.3 kg (219 lb), SpO2 98 %.Body mass index is 34.3 kg/m.  General Appearance: Casually dressed. Good hygiene. No tremors. Not sweaty. Not in any distress. Has been observed out with peers. Appropriate behavior.   Eye Contact:  Good  Speech:  Spontaneous, not pressured.  Volume:  Normal  Mood:  Euthymic  Affect:  Full range and mood congruent.   Thought Process:  Linear  Orientation:  Full (Time, Place, and  Person)  Thought Content:  Future oriented. No delusional theme. No preoccupation with violent thoughts.  No hallucination in any modality.   Suicidal Thoughts:  No  Homicidal Thoughts:  No  Memory:  WNL  Judgement: Better  Insight:  Better  Psychomotor Activity:  Normal  Concentration:  Better  Recall:  Fair  Fund of Knowledge:  Fair  Language:  Good  Akathisia:  Negative  Handed:    AIMS (if indicated):     Assets:  Communication Skills Desire for Improvement Financial Resources/Insurance Housing Intimacy Transportation Vocational/Educational  ADL's: Good  Cognition:  WNL  Sleep:  Number of Hours: 6     Treatment Plan Summary: Patient has completely come off psychoactive substances. No complications. No evidence of psychosis. No evidence of mania. No suicidal or homicidal thoughts. Psychomotor is getting bettter  Hopeful discharge early next week.    Psychiatric: SUD Substance related anxiety and mood disorder  Medical: LBP Chronic pain Obesity  Psychosocial:   PLAN: 1. Continue current regimen 2. Continue to monitor mood, behavior and interaction with peers.    Georgiann Cocker, MD 01/11/2017, 2:55 PMPatient ID: Cathy Barker. Prak, female   DOB: 03/25/1971, 46 y.o.   MRN: [id number] Patient ID: Cathy Barker, female   DOB: April 19, 1971, 46 y.o.   MRN: [id number]

## 2017-01-11 NOTE — Progress Notes (Signed)
Patient did not attend wrap up group. 

## 2017-01-11 NOTE — Progress Notes (Signed)
Pt reports that her diarrhea and nausea have decreased this evening and she was able to go eat in the cafeteria.  She still c/o back pain and some stomach cramping.  She denies SI/HI/AVH.  She reports her main withdrawal symptoms are anxiety and pain.  She still frequently asks for prn medications, and when she takes them, she wants to know when she can get the next dose.  Pt has been up most of the evening, but she has not yet gone to a group.  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  Writer informed pt that she was responsible to go to all the groups on her hall unless she had a doctor's permission to not attend.  Support and encouragement offered.  Discharge plans are in process.  Pt plans to return home at discharge.  Safety maintained with q15 minute checks.

## 2017-01-11 NOTE — Plan of Care (Signed)
Problem: Coping: Goal: Ability to identify and develop effective coping behavior will improve Outcome: Progressing Patient is actively practicing her coping skills while on the unit.

## 2017-01-12 DIAGNOSIS — G4700 Insomnia, unspecified: Secondary | ICD-10-CM

## 2017-01-12 DIAGNOSIS — M545 Low back pain: Secondary | ICD-10-CM

## 2017-01-12 DIAGNOSIS — F329 Major depressive disorder, single episode, unspecified: Secondary | ICD-10-CM

## 2017-01-12 LAB — COMPREHENSIVE METABOLIC PANEL
ALT: 53 U/L (ref 14–54)
ANION GAP: 9 (ref 5–15)
AST: 57 U/L — ABNORMAL HIGH (ref 15–41)
Albumin: 3.3 g/dL — ABNORMAL LOW (ref 3.5–5.0)
Alkaline Phosphatase: 67 U/L (ref 38–126)
BILIRUBIN TOTAL: 0.6 mg/dL (ref 0.3–1.2)
BUN: 9 mg/dL (ref 6–20)
CO2: 27 mmol/L (ref 22–32)
Calcium: 8.7 mg/dL — ABNORMAL LOW (ref 8.9–10.3)
Chloride: 107 mmol/L (ref 101–111)
Creatinine, Ser: 0.59 mg/dL (ref 0.44–1.00)
GFR calc non Af Amer: >60 mL/min (ref >60)
Glucose, Bld: 90 mg/dL (ref 65–99)
Potassium: 3.4 mmol/L — ABNORMAL LOW (ref 3.5–5.1)
SODIUM: 143 mmol/L (ref 135–145)
TOTAL PROTEIN: 6.2 g/dL — AB (ref 6.5–8.1)

## 2017-01-12 MED ORDER — NYSTATIN 100000 UNIT/GM EX POWD
Freq: Three times a day (TID) | CUTANEOUS | Status: DC
  Administered 2017-01-12 – 2017-01-13 (×4): via TOPICAL
  Filled 2017-01-12: qty 15

## 2017-01-12 NOTE — Progress Notes (Signed)
[facility] MD Progress Note  01/12/2017 10:34 AM Cathy Barker  MRN:  [id number]    Subjective: Patient reports " I am feeling much better than when I came in. I have been able to rest throughout the night." - Patient is requesting to be restarted on Lasix for ("excessive water weight". ) - of note patient potassium is 3.4. Education was provided with medication and electrolytes.   Objective:Cathy Barker is awake, alert and oriented. Seen sitting in dayroom, interacting with peers. Denies suicidal or homicidal ideation. Denies auditory or visual hallucination and does not appear to be responding to internal stimuli.  Patient reports she is medication compliant without mediation side effects. Patient reports she is going to find a NA meeting after discharge. Patient denies depression or depressive symptoms. Reports good appetite other wise and resting well.  Support, encouragement and reassurance was provided.    Principal Problem: Substance Use Disorder Diagnosis:   Patient Active Problem List   Diagnosis Date Noted  . Substance induced mood disorder ([place]) [F19.94] 01/07/2017  . Alcohol withdrawal ([place]) [F10.239] 06/28/2016  . Hypokalemia [E87.6] 06/26/2016  . Syncope and collapse [R55] 06/26/2016  . Prolonged QT interval [R94.31] 06/26/2016  . Anxiety [F41.9] 06/26/2016  . Alcohol abuse [F10.10] 06/26/2016   Total Time spent with patient: 20 minutes  Past Psychiatric History: As in H&P  Past Medical History:  Past Medical History:  Diagnosis Date  . Anxiety   . Depression     Past Surgical History:  Procedure Laterality Date  . NO PAST SURGERIES     Family History:  Family History  Problem Relation Age of Onset  . Diabetes Mother   . Diabetes Father   . Lymphoma Father   . CAD Father   . Diabetes Other   . Cancer Other   . CAD Other    Family Psychiatric  History: As in H&P Social History:  History  Alcohol Use  . Yes    Comment: she does binge  drinking     History  Drug Use    Social History   Social History  . Marital status: Married    Spouse name: N/A  . Number of children: N/A  . Years of education: N/A   Social History Main Topics  . Smoking status: Current Every Day Smoker    Packs/day: 1.50    Types: Cigarettes  . Smokeless tobacco: Never Used  . Alcohol use Yes     Comment: she does binge drinking  . Drug use: Yes  . Sexual activity: Yes    Birth control/ protection: None   Other Topics Concern  . None   Social History Narrative  . None   Additional Social History:    Pain Medications: see MAR Prescriptions: see MAR Over the Counter: see MAR       Sleep: Good  Appetite:  Better  Current Medications: Current Facility-Administered Medications  Medication Dose Route Frequency Provider Last Rate Last Dose  . acetaminophen (TYLENOL) tablet 650 mg  650 mg Oral Q6H PRN Nanci Pina, FNP   650 mg at 01/08/17 0856  . alum & mag hydroxide-simeth (MAALOX/MYLANTA) 200-200-20 MG/5ML suspension 30 mL  30 mL Oral Q4H PRN Starkes, Takia S, FNP      . cloNIDine (CATAPRES) tablet 0.1 mg  0.1 mg Oral QAC breakfast Lindell Spar I, NP   0.1 mg at 01/12/17 0757  . dicyclomine (BENTYL) tablet 20 mg  20 mg Oral Q6H PRN Encarnacion Slates, NP  20 mg at 01/12/17 0806  . DULoxetine (CYMBALTA) DR capsule 30 mg  30 mg Oral Daily Nanci Pina, FNP   30 mg at 01/12/17 0757  . gabapentin (NEURONTIN) tablet 600 mg  600 mg Oral TID Artist Beach, MD   600 mg at 01/12/17 0757  . hydrOXYzine (ATARAX/VISTARIL) tablet 25 mg  25 mg Oral TID PRN Nanci Pina, FNP   25 mg at 01/11/17 2013  . ibuprofen (ADVIL,MOTRIN) tablet 600 mg  600 mg Oral Q6H PRN Lindell Spar I, NP   600 mg at 01/12/17 0759  . lidocaine (LIDODERM) 5 % 1 patch  1 patch Transdermal Q24H Izediuno, Laruth Bouchard, MD   1 patch at 01/12/17 0757  . loperamide (IMODIUM) capsule 2-4 mg  2-4 mg Oral PRN Lindell Spar I, NP   2 mg at 01/11/17 1151  . magnesium  hydroxide (MILK OF MAGNESIA) suspension 30 mL  30 mL Oral Daily PRN Lavina Hamman, Takia S, FNP      . methocarbamol (ROBAXIN) tablet 500 mg  500 mg Oral Q8H PRN Lindell Spar I, NP   500 mg at 01/12/17 0806  . multivitamin with minerals tablet 1 tablet  1 tablet Oral Daily Nanci Pina, FNP   1 tablet at 01/12/17 0757  . nicotine (NICODERM CQ - dosed in mg/24 hours) patch 21 mg  21 mg Transdermal Q0600 Nanci Pina, FNP   21 mg at 01/12/17 0758  . nystatin (MYCOSTATIN/NYSTOP) topical powder   Topical TID Lindell Spar I, NP      . ondansetron (ZOFRAN-ODT) disintegrating tablet 4 mg  4 mg Oral Q8H PRN Lindell Spar I, NP   4 mg at 01/11/17 2201  . potassium chloride SA (K-DUR,KLOR-CON) CR tablet 20 mEq  20 mEq Oral Daily Izediuno, Laruth Bouchard, MD   20 mEq at 01/12/17 0757  . thiamine (B-1) injection 100 mg  100 mg Intramuscular Once Starkes, Takia S, FNP      . thiamine (VITAMIN B-1) tablet 100 mg  100 mg Oral Daily Priscille Loveless S, FNP   100 mg at 01/12/17 0757  . traZODone (DESYREL) tablet 50 mg  50 mg Oral QHS,MR X 1 Starkes, Takia S, FNP   50 mg at 01/11/17 2248    Lab Results:  Results for orders placed or performed during the hospital encounter of 01/07/17 (from the past 48 hour(s))  Comprehensive metabolic panel     Status: Abnormal   Collection Time: 01/12/17  6:07 AM  Result Value Ref Range   Sodium 143 135 - 145 mmol/L   Potassium 3.4 (L) 3.5 - 5.1 mmol/L   Chloride 107 101 - 111 mmol/L   CO2 27 22 - 32 mmol/L   Glucose, Bld 90 65 - 99 mg/dL   BUN 9 6 - 20 mg/dL   Creatinine, Ser 0.59 0.44 - 1.00 mg/dL   Calcium 8.7 (L) 8.9 - 10.3 mg/dL   Total Protein 6.2 (L) 6.5 - 8.1 g/dL   Albumin 3.3 (L) 3.5 - 5.0 g/dL   AST 57 (H) 15 - 41 U/L   ALT 53 14 - 54 U/L   Alkaline Phosphatase 67 38 - 126 U/L   Total Bilirubin 0.6 0.3 - 1.2 mg/dL   GFR calc non Af Amer >60 >60 mL/min   GFR calc Af Amer >60 >60 mL/min    Comment: (NOTE) The eGFR has been calculated using the CKD EPI  equation. This calculation has not been validated in all clinical situations.  eGFR's persistently <60 mL/min signify possible Chronic Kidney Disease.    Anion gap 9 5 - 15    Comment: Performed at [facility], [place] [address], [place], [place] [address]    Blood Alcohol level:  No results found for: [facility]  Metabolic Disorder Labs: Lab Results  Component Value Date   HGBA1C 5.1 01/08/2017   MPG 100 01/08/2017   MPG 97 06/27/2016   Lab Results  Component Value Date   PROLACTIN 34.0 (H) 01/08/2017   Lab Results  Component Value Date   CHOL 237 (H) 01/08/2017   TRIG 172 (H) 01/08/2017   HDL 94 01/08/2017   CHOLHDL 2.5 01/08/2017   VLDL 34 01/08/2017   LDLCALC 109 (H) 01/08/2017    Physical Findings: AIMS: Facial and Oral Movements Muscles of Facial Expression: None, normal Lips and Perioral Area: None, normal Jaw: None, normal Tongue: None, normal,Extremity Movements Upper (arms, wrists, hands, fingers): None, normal Lower (legs, knees, ankles, toes): None, normal, Trunk Movements Neck, shoulders, hips: None, normal, Overall Severity Severity of abnormal movements (highest score from questions above): None, normal Incapacitation due to abnormal movements: None, normal Patient's awareness of abnormal movements (rate only patient's report): No Awareness, Dental Status Current problems with teeth and/or dentures?: Yes Does patient usually wear dentures?: No  CIWA:  CIWA-Ar Total: 7 COWS:  COWS Total Score: 6  Musculoskeletal: Strength & Muscle Tone: within normal limits Gait & Station: broad based Patient leans: N/A  Psychiatric Specialty Exam: Physical Exam  Constitutional: She is oriented to person, place, and time. She appears well-developed and well-nourished.  HENT:  Head: Normocephalic.  Eyes: Pupils are equal, round, and reactive to light.  Neck: Normal range of motion. Neck supple.  Cardiovascular: Normal rate.   Musculoskeletal: She  exhibits no edema.  Neurological: She is alert and oriented to person, place, and time.  Psychiatric: She has a normal mood and affect. Her behavior is normal.  As above    Review of Systems  Skin:       Bruising and abrasion to left lower back, reports a standing height fall  Psychiatric/Behavioral: Positive for depression. Negative for suicidal ideas. The patient is nervous/anxious.     Blood pressure 115/89, pulse 97, temperature 98.4 F (36.9 C), resp. rate 18, height _0  (1.702 m), weight 99.3 kg (219 lb), SpO2 98 %.Body mass index is 34.3 kg/m.  General Appearance: Casually dressed.   Eye Contact:  Good  Speech: clear and coherent   Volume:  Normal  Mood:  Euthymic  Affect:  Full range and mood congruent.   Thought Process:  Linear  Orientation:  Full (Time, Place, and Person)  Thought Content:  Future oriented.WNL  Suicidal Thoughts:  No  Homicidal Thoughts:  No  Memory:  WNL  Judgement: Better  Insight:  Better  Psychomotor Activity:  Normal  Concentration:  Better  Recall:  [place] of Knowledge:  Fair  Language:  Good  Akathisia:  Negative  Handed:    AIMS (if indicated):     Assets:  Communication Skills Desire for Improvement Financial Resources/Insurance Housing Intimacy Transportation Vocational/Educational  ADL's: Good  Cognition:  WNL  Sleep:  Number of Hours: 6     I agree with current treatment plan on 01/12/2017, Patient seen face-to-face for psychiatric evaluation follow-up, chart reviewed.. Reviewed the information documented and agree with the treatment plan.  Treatment Plan Summary: Daily contact with patient to assess and evaluate symptoms and progress in treatment and Medication  management  Continue with Cymbalta 30 mg, Neurontin 600 mg  for mood stabilization. Continue with Trazodone 66m for insomnia Completed COWs/ Librium Protocol Will continue to monitor vitals ,medication compliance and treatment side effects while patient is  here.  CSW will start working on disposition.  Patient to participate in therapeutic milieu  Psychiatric: SUD Substance related anxiety and mood disorder  Medical: LBP Chronic pain Obesity  Psychosocial:   PLAN: 1. Continue current regimen 2. Continue to monitor mood, behavior and interaction with peers.    [place]ill [place] NP 01/12/2017, 10:34 AM

## 2017-01-12 NOTE — Progress Notes (Signed)
Patient did attend the evening speaker AA meeting.  

## 2017-01-12 NOTE — [facility] Group Notes (Signed)
[facility] Group Notes:  (Nursing/MHT/Case Management/Adjunct)  Date:  01/12/2017  Time:  4:29 PM   Type of Therapy:  Psychoeducational Skills  Participation Level:  Active  Participation Quality:  Appropriate  Affect:  Appropriate  Cognitive:  Appropriate  Insight:  Appropriate  Engagement in Group:  Engaged  Modes of Intervention:  Problem-solving  Summary of Progress/Problems:  Group encouraged to surround themselves with positive and healthy group/support system when changing to a healthy life style.    Bethann PunchesJane O Wei Newbrough 01/12/2017, 4:29 PM
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
## 2017-01-12 NOTE — [facility] Group Notes (Signed)
  [facility] LCSW Group Therapy  01/12/2017 11 AM  Type of Therapy:  Group Therapy  Participation Level:  Active  Participation Quality:  Attentive, Intrusive and Sharing  Affect:  Excited  Cognitive:  Alert and Oriented  Insight:  Developing/Improving  Engagement in Therapy:  Developing/Improving  Modes of Intervention:  Discussion, Exploration, Problem-solving, Socialization and Support  Summary of Progress/Problems: Topic for today was thoughts and feelings regarding discharge. We discussed fears of upcoming changes including judgements, expectations and stigma of mental health issues. We then discussed supports: what constitutes a supportive framework, identification of supports and what to do when others are not supportive. Patients then processed the concept of self care and what that might look like.   Carney Bernatherine C Vere Diantonio, LCSW

## 2017-01-12 NOTE — Plan of Care (Signed)
Problem: Education: Goal: Ability to state activities that reduce stress will improve Outcome: Progressing Patient is having a difficult time identifying and using alternate (from medications) coping skills to help with her anxiety. Patient hs been given a number coping skills and she has agreed to practice them through out the day.

## 2017-01-13 MED ORDER — DULOXETINE HCL 30 MG PO CPEP: 30.0000 mg | ORAL_CAPSULE | Freq: Every day | ORAL | 0 refills | Status: AC

## 2017-01-13 MED ORDER — HYDROXYZINE HCL 25 MG PO TABS: 25.0000 mg | ORAL_TABLET | Freq: Three times a day (TID) | ORAL | 0 refills | Status: AC | PRN

## 2017-01-13 MED ORDER — NICOTINE 21 MG/24HR TD PT24: 21.0000 mg | MEDICATED_PATCH | Freq: Every day | TRANSDERMAL | 0 refills | Status: AC

## 2017-01-13 MED ORDER — GABAPENTIN 600 MG PO TABS: 600.0000 mg | ORAL_TABLET | Freq: Three times a day (TID) | ORAL | 0 refills | Status: AC

## 2017-01-13 NOTE — [facility] Group Notes (Signed)
PT SUMMARY   Cathy Barker was present throughout group.  Alert and participatory in group discussion.  Showed support of a younger group member throughout group.  Cathy Barker named relationship with her children as a primary point of loss and grief.  Stated that she keeps grief inside and doesn't let others see how much she is carrying.  [facility] and fear around discharging.  Group encouraged her to find places of connection [facility].    GROUP SUMMARY   Pt attended spiritual care group on grief and loss facilitated by chaplain Burnis KingfisherMatthew Jaevion Goto   Group opened with brief discussion and psycho-social ed around grief and loss in relationships and in relation to self - identifying life patterns, circumstances, changes that cause losses. Established group norm of speaking from own life experience. Group goal of establishing open and affirming space for members to share loss and experience with grief, normalize grief experience and provide psycho social education and grief support.    [facility] Chaplain Burnis KingfisherMatthew Noor [place], [place]  Page [phone]  Office [phone]

## 2017-01-13 NOTE — Progress Notes (Signed)
Patient ID: Cathy Barker, female   DOB: 30-Dec-1970, 46 y.o.   MRN: [id number]  Discharge Note: Belongings returned to patient at time of discharge. Discharge instructions and medications were reviewed with patient. Patient verbalized understanding of both medications and discharge instructions. Patient discharged to lobby where her ride was waiting. Q15 minute safety checks maintained until discharge.

## 2017-01-13 NOTE — Progress Notes (Signed)
Recreation Therapy Notes  Date: 01/13/17 Time: 0930 Location: [address] Group Room  Group Topic: Stress Management  Goal Area(s) Addresses:  Patient will verbalize importance of using healthy stress management.  Patient will identify positive emotions associated with healthy stress management.   Behavioral Response: Engaged  Intervention: Stress Management  Activity :  Meditation.  LRT introduced the stress management technique of meditation.  LRT played Barker meditation from the Calm app to allow patients to participate in meditating.  Patients were to follow along with the meditation to fully engage in the technique.  Education:  Stress Management, Discharge Planning.   Education Outcome: Acknowledges edcuation/In group clarification offered/Needs additional education  Clinical Observations/Feedback: Pt attended group.   Cathy Barker, LRT/CTRS         Cathy Barker 01/13/2017 12:29 PM 
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
## 2017-01-13 NOTE — Plan of Care (Signed)
Problem: Activity: Goal: Interest or engagement in activities will improve Outcome: Progressing Pt did attend evening group tonight and has been working on her work books, home work for unit

## 2017-01-13 NOTE — Tx Team (Signed)
Interdisciplinary Treatment and Diagnostic Plan Update  01/13/2017 Time of Session: [place] Himmelberger MRN: [id number]  Principal Diagnosis: Major Depressive Disorder, Severe  Secondary Diagnoses: Active Problems:   Substance induced mood disorder (HCC)   Current Medications:  Current Facility-Administered Medications  Medication Dose Route Frequency Provider Last Rate Last Dose  . acetaminophen (TYLENOL) tablet 650 mg  650 mg Oral Q6H PRN Nanci Pina, FNP   650 mg at 01/12/17 1939  . alum & mag hydroxide-simeth (MAALOX/MYLANTA) 200-200-20 MG/5ML suspension 30 mL  30 mL Oral Q4H PRN Starkes, Takia S, FNP      . dicyclomine (BENTYL) tablet 20 mg  20 mg Oral Q6H PRN Lindell Spar I, NP   20 mg at 01/12/17 2205  . DULoxetine (CYMBALTA) DR capsule 30 mg  30 mg Oral Daily Nanci Pina, FNP   30 mg at 01/13/17 0753  . gabapentin (NEURONTIN) tablet 600 mg  600 mg Oral TID Artist Beach, MD   600 mg at 01/13/17 0753  . hydrOXYzine (ATARAX/VISTARIL) tablet 25 mg  25 mg Oral TID PRN Nanci Pina, FNP   25 mg at 01/12/17 1829  . ibuprofen (ADVIL,MOTRIN) tablet 600 mg  600 mg Oral Q6H PRN Lindell Spar I, NP   600 mg at 01/13/17 0650  . lidocaine (LIDODERM) 5 % 1 patch  1 patch Transdermal Q24H Izediuno, Laruth Bouchard, MD   1 patch at 01/13/17 0753  . loperamide (IMODIUM) capsule 2-4 mg  2-4 mg Oral PRN Lindell Spar I, NP   2 mg at 01/11/17 1151  . magnesium hydroxide (MILK OF MAGNESIA) suspension 30 mL  30 mL Oral Daily PRN Lavina Hamman, Takia S, FNP      . methocarbamol (ROBAXIN) tablet 500 mg  500 mg Oral Q8H PRN Lindell Spar I, NP   500 mg at 01/12/17 1829  . multivitamin with minerals tablet 1 tablet  1 tablet Oral Daily Nanci Pina, FNP   1 tablet at 01/13/17 0753  . nicotine (NICODERM CQ - dosed in mg/24 hours) patch 21 mg  21 mg Transdermal Q0600 Nanci Pina, FNP   21 mg at 01/13/17 0752  . nystatin (MYCOSTATIN/NYSTOP) topical powder   Topical TID Lindell Spar I, NP       . ondansetron (ZOFRAN-ODT) disintegrating tablet 4 mg  4 mg Oral Q8H PRN Lindell Spar I, NP   4 mg at 01/11/17 2201  . potassium chloride SA (K-DUR,KLOR-CON) CR tablet 20 mEq  20 mEq Oral Daily Izediuno, Laruth Bouchard, MD   20 mEq at 01/13/17 0753  . thiamine (B-1) injection 100 mg  100 mg Intramuscular Once Starkes, Takia S, FNP      . thiamine (VITAMIN B-1) tablet 100 mg  100 mg Oral Daily Nanci Pina, FNP   100 mg at 01/13/17 0753  . traZODone (DESYREL) tablet 50 mg  50 mg Oral QHS,MR X 1 Starkes, Takia S, FNP   50 mg at 01/12/17 2238   PTA Medications: Prescriptions Prior to Admission  Medication Sig Dispense Refill Last Dose  . ALPRAZolam (XANAX) 1 MG tablet Take 1 mg by mouth 3 (three) times daily.   01/06/2017  . folic acid (FOLVITE) 1 MG tablet Take 1 tablet (1 mg total) by mouth daily. 30 tablet 0 01/06/2017  . furosemide (LASIX) 40 MG tablet Take 1 tablet (40 mg total) by mouth daily. (Patient taking differently: Take 40 mg by mouth 2 (two) times daily. ) 30 tablet 0 01/06/2017  .  HYDROcodone-acetaminophen (NORCO) 10-325 MG tablet Take 1 tablet by mouth every 6 (six) hours as needed for moderate pain.   01/06/2017  . Multiple Vitamin (MULTIVITAMIN WITH MINERALS) TABS tablet Take 1 tablet by mouth daily.   01/06/2017  . potassium chloride (K-DUR) 10 MEQ tablet Take 2 tablets (20 mEq total) by mouth daily. 60 tablet 0 01/06/2017  . thiamine 100 MG tablet Take 1 tablet (100 mg total) by mouth daily. 30 tablet 0 01/06/2017  . traMADol (ULTRAM) 50 MG tablet Take 50 mg by mouth every 6 (six) hours as needed for moderate pain.   01/06/2017  . chlordiazePOXIDE (LIBRIUM) 25 MG capsule Take 1 tablet 4 times a day (every 6 hours) for 1 day, then take 1 tablet three times a day (every 8 hours) for 1 day, then take 1 tablet twice a day for 1 day, then take 1 tablet once a day for 1 day. (Patient not taking: Reported on 01/08/2017) 10 capsule 0 Not Taking at Unknown time  . DULoxetine (CYMBALTA) 30 MG capsule Take  1 capsule (30 mg total) by mouth daily. (Patient not taking: Reported on 01/08/2017) 30 capsule 0 Not Taking at Unknown time  . traZODone (DESYREL) 50 MG tablet Take 1 tablet (50 mg total) by mouth at bedtime as needed for sleep. (Patient not taking: Reported on 01/08/2017) 30 tablet 0 Not Taking at Unknown time    Patient Stressors: Financial difficulties Substance abuse  Patient Strengths: Network engineer for treatment/growth Supportive family/friends  Treatment Modalities: Medication Management, Group therapy, Case management,  1 to 1 session with clinician, Psychoeducation, Recreational therapy.   Physician Treatment Plan for Primary Diagnosis: Major Depressive Disorder, Severe  Medication Management: Evaluate patient's response, side effects, and tolerance of medication regimen.  Therapeutic Interventions: 1 to 1 sessions, Unit Group sessions and Medication administration.  Evaluation of Outcomes: Met  Physician Treatment Plan for Secondary Diagnosis: Active Problems:   Substance induced mood disorder ([place])  Long Term Goal(s): Improvement in symptoms so as ready for discharge Improvement in symptoms so as ready for discharge   Short Term Goals: Ability to identify changes in lifestyle to reduce recurrence of condition will improve Ability to verbalize feelings will improve Ability to disclose and discuss suicidal ideas Ability to demonstrate self-control will improve Ability to identify and develop effective coping behaviors will improve Ability to maintain clinical measurements within normal limits will improve Compliance with prescribed medications will improve Ability to identify triggers associated with substance abuse/mental health issues will improve Ability to identify changes in lifestyle to reduce recurrence of condition will improve Ability to verbalize feelings will improve Ability to disclose and discuss suicidal  ideas Ability to demonstrate self-control will improve Ability to identify and develop effective coping behaviors will improve Ability to maintain clinical measurements within normal limits will improve Compliance with prescribed medications will improve Ability to identify triggers associated with substance abuse/mental health issues will improve     Medication Management: Evaluate patient's response, side effects, and tolerance of medication regimen.  Therapeutic Interventions: 1 to 1 sessions, Unit Group sessions and Medication administration.  Evaluation of Outcomes: Met   RN Treatment Plan for Primary Diagnosis: Major Depressive Disorder, Severe Long Term Goal(s): Knowledge of disease and therapeutic regimen to maintain health will improve  Short Term Goals: Ability to remain free from injury will improve, Ability to verbalize feelings will improve and Ability to disclose and discuss suicidal ideas  Medication Management: RN will administer medications as ordered  by provider, will assess and evaluate patient's response and provide education to patient for prescribed medication. RN will report any adverse and/or side effects to prescribing provider.  Therapeutic Interventions: 1 on 1 counseling sessions, Psychoeducation, Medication administration, Evaluate responses to treatment, Monitor vital signs and CBGs as ordered, Perform/monitor CIWA, COWS, AIMS and Fall Risk screenings as ordered, Perform wound care treatments as ordered.  Evaluation of Outcomes: Met   LCSW Treatment Plan for Primary Diagnosis:Major Depressive Disorder, Severe Long Term Goal(s): Safe transition to appropriate next level of care at discharge, Engage patient in therapeutic group addressing interpersonal concerns.  Short Term Goals: Engage patient in aftercare planning with referrals and resources, Facilitate patient progression through stages of change regarding substance use diagnoses and concerns and Identify  triggers associated with mental health/substance abuse issues  Therapeutic Interventions: Assess for all discharge needs, 1 to 1 time with Social worker, Explore available resources and support systems, Assess for adequacy in community support network, Educate family and significant other(s) on suicide prevention, Complete Psychosocial Assessment, Interpersonal group therapy.  Evaluation of Outcomes: Met   Progress in Treatment: Attending groups: Yes. Participating in groups: Yes. Taking medication as prescribed: Yes. Toleration medication: Yes. Family/Significant other contact made: SPE completed with pt's husband/collateral information also obtained.  Patient understands diagnosis: Yes. Discussing patient identified problems/goals with staff: Yes. Medical problems stabilized or resolved: Yes. Denies suicidal/homicidal ideation: Yes. Issues/concerns per patient self-inventory: No. Other: n/a   New problem(s) identified: No, Describe:  n/a  New Short Term/Long Term Goal(s): detox; medication stabilization, decrease in depressive/anxiety symptoms, and development of comprehensive mental wellness/sobriety plan. PT reports that she has been abusing alcohol, benzos, and opioids.   Discharge Plan or Barriers: CSW assessing for appropriate referrals. Pt was going to her PCP for xanax which was discontinued. No mental health providers per patient. She lives with her husband and 2 children.  6/11: Pt referred to [facility] per her request. She also has appt at [facility] scheduled for assessment--would like to do group therapy at that agency.   Reason for Continuation of Hospitalization: none  Estimated Length of Stay: discharge today   Attendees: Patient: 01/13/2017 8:56 AM  Physician: Dr. Sanjuana Letters MD 01/13/2017 8:56 AM  Nursing: Aileen Fass RN 01/13/2017 8:56 AM  RN Care Manager: Lars Pinks CM 01/13/2017 8:56 AM  Social Worker: Maxie Better, LCSW  01/13/2017 8:56 AM  Recreational Therapist: x 01/13/2017 8:56 AM  Other: Ricky Ala NP 01/13/2017 8:56 AM  Other:  01/13/2017 8:56 AM  Other: 01/13/2017 8:56 AM    Scribe for Treatment Team: Baker, LCSW 01/13/2017 8:56 AM

## 2017-01-13 NOTE — Progress Notes (Signed)
  [facility] Discharge Plan :  Will you be returning to the same living situation after discharge:  Yes,  home At discharge, do you have transportation home?: Yes,  husband Do you have the ability to pay for your medications: Yes,  mental health/out of pocket pay per patient  Release of information consent forms completed and submitted to medical records by CSW.  Patient to Follow up at: Follow-up Information    [facility] Follow up on 01/29/2017.   Why:  Appt at 10:00AM for medication management with Dr. Rachel MouldsGullapelli. Please bring photo ID. They are aware that you will be paying out of pocket. Thank you. Contact information: ATTN: Dr. Rachel MouldsGullapelli  [address] [place], [place] [address] Phone: [phone] Fax: [phone]       [facility] Follow up on 01/15/2017.   Why:  Assessment for group counseling services at 9:45AM. Please let them know that you are planning to [facility]. Please bring: photo ID, social security card, and any proof of household income if you have it. Thank you.  Contact information: [address] [place] [address] [phone]           Next level of care provider has access to [facility] Link:yes  Safety Planning and Suicide Prevention discussed: SPE completed with pt's husband. SPI pamphlet and Mobile Crisis information provided.  Have you used any form of tobacco in the last 30 days? (Cigarettes, Smokeless Tobacco, Cigars, and/or Pipes): Yes  Has patient been referred to the Quitline?: Patient refused referral  Patient has been referred for addiction treatment: Yes  Rasha Ibe N Smart  LCSW 01/13/2017, 9:37 AM

## 2017-01-13 NOTE — Progress Notes (Signed)
D.  Pt pleasant but anxious on approach.  Complaint of beginning migraine.  Pt requested Tylenol with Ibuprofen given later for this.  Pt did attend evening AA group, observed engaged in appropriate interaction with peers.  Medication did alleviate migraine.  Pt denies SI/HI/hallucinations at this time.  A.  Support and encouragement offered, medication given as ordered  R. Pt remains safe on the unit, will continue to monitor.

## 2017-01-13 NOTE — Progress Notes (Signed)
Patient ID: Marland McalpineStephanie O. Eskridge, female   DOB: May 31, 1971, 46 y.o.   MRN: [id number]  DAR: Pt. Denies SI/HI and A/V Hallucinations. She reports sleep is fair, appetite is good, energy level is normal, and concentration is good. She rates depression 1/10, hopelessness 0/10, and anxiety 4/10. She reports pain in her lower back and is receiving PRN and scheduled medication for this. Patient reports that she has "swelling" but states, "I will follow-up with my doctor when I get home." She does not show this writer her area of swelling. Support and encouragement provided to the patient. Scheduled medication administered to patient per physician's orders. Patient is seen in the milieu interacting with peers and is cooperative with staff. Q15 minute checks are maintained for safety.

## 2017-01-13 NOTE — [facility] Suicide Risk Assessment (Signed)
[facility] Discharge Suicide Risk Assessment   Principal Problem: <principal problem not specified> Discharge Diagnoses:  Patient Active Problem List   Diagnosis Date Noted  . Substance induced mood disorder (HCC) [F19.94] 01/07/2017  . Alcohol withdrawal (HCC) [F10.239] 06/28/2016  . Hypokalemia [E87.6] 06/26/2016  . Syncope and collapse [R55] 06/26/2016  . Prolonged QT interval [R94.31] 06/26/2016  . Anxiety [F41.9] 06/26/2016  . Alcohol abuse [F10.10] 06/26/2016    Total Time spent with patient: 45 minutes  Musculoskeletal: Strength & Muscle Tone: within normal limits Gait & Station: normal Patient leans: N/A  Psychiatric Specialty Exam: Review of Systems  Constitutional: Negative.   HENT: Negative.   Eyes: Negative.   Respiratory: Negative.   Cardiovascular: Negative.   Gastrointestinal: Negative.   Genitourinary: Negative.   Musculoskeletal: Negative.   Skin: Negative.   Neurological: Negative.   Endo/Heme/Allergies: Negative.   Psychiatric/Behavioral: Negative for depression, hallucinations, memory loss, substance abuse and suicidal ideas. The patient is not nervous/anxious and does not have insomnia.     Blood pressure 102/83, pulse (!) 104, temperature 98.1 F (36.7 C), resp. rate 18, height 5\' 7"  (1.702 m), weight 99.3 kg (219 lb), SpO2 98 %.Body mass index is 34.3 kg/m.  General Appearance: Neatly dressed, pleasant, engaging well and cooperative. Appropriate behavior. Not in any distress. Good relatedness. Not internally stimulated  Eye Contact::  Good  Speech:  Spontaneous, normal prosody. Normal tone and rate.   Volume:  Normal  Mood:  Euthymic  Affect:  Appropriate and Full Range  Thought Process:  Linear  Orientation:  Full (Time, Place, and Person)  Thought Content:  Future oriented. No delusional theme. No preoccupation with violent thoughts. No negative ruminations. No obsession.  No hallucination in any modality.   Suicidal Thoughts:  No  Homicidal  Thoughts:  No  Memory:  Immediate;   Good Recent;   Good Remote;   Good  Judgement:  Good  Insight:  Good  Psychomotor Activity:  Normal  Concentration:  Good  Recall:  Good  Fund of Knowledge:Good  Language: Good  Akathisia:  NA  Handed:    AIMS (if indicated):     Assets:  Communication Skills Desire for Improvement Financial Resources/Insurance Housing Intimacy Physical Health Social Support Transportation Vocational/Educational  Sleep:  Number of Hours: 5.75  Cognition: WNL  ADL's:  Intact   Clinical  Assessment::   46 yo Caucasian female, married, lives with her family. Referred from [facility] ER. Presented there via emergency services. Background history of SUD (alcohol, benzodiazepines, opiates), Mood and anxiety disorder. Patient was reported to have fell at home. Her husband called emergency services. She was intoxicated with alcohol at presentation. Patient transferred here to seek help with detox.  Seen today. Says she is pleased with the progress she made. She feels her old self. Notes that she does not need to depend on any substance to feel good. Says she did this herself voluntarily and she is proud of herself. No evidence of withdrawals. No evidence of depression. No evidence of psychosis or mania. No overwhelming anxiety.  Her husband would be picking her up. Says her family is very excited with how she has transformed back to her old self again.   Nursing staff reports that patient has been appropriate on the unit. Patient has been interacting well with peers. No behavioral issues. Patient has not voiced any suicidal thoughts. Patient has not been observed to be internally stimulated. Patient has been adherent with treatment recommendations. Patient has been tolerating their medication  well.   Patient was discussed at team. Team members feels that patient is back to her baseline level of function. Team agrees with plan to discharge patient today.  Demographic  Factors:  NA  Loss Factors: NA  Historical Factors: NA  Risk Reduction Factors:   Responsible for children under 47 years of age, Sense of responsibility to family, Religious beliefs about death, Living with another person, especially a relative, Positive social support, Positive therapeutic relationship and Positive coping skills or problem solving skills  Continued Clinical Symptoms:   As above  Cognitive Features That Contribute To Risk:  None    Suicide Risk:  Minimal: No identifiable suicidal ideation.  Patient is not having any thoughts of suicide at this time. Modifiable risk factors targeted during this admission includes chronic pain and substance use. Demographical and historical risk factors cannot be modified. Patient is now engaging well. Patient is reliable and is future oriented. We have buffered patient's support structures. At this point, patient is at low risk of suicide. Patient is aware of the effects of psychoactive substances on decision making process. Patient has been provided with emergency contacts. Patient acknowledges to use resources provided if unforseen circumstances changes their current risk stratification.    Follow-up Information    [facility] Follow up on 01/29/2017.   Why:  Appt at 10:00AM for medication management with Dr. Rachel MouldsGullapelli. Please bring photo ID. They are aware that you will be paying out of pocket. Thank you. Contact information: ATTN: Dr. Rachel MouldsGullapelli  [address] [place], [place] [address] Phone: [phone] Fax: [phone]       [facility] Follow up on 01/15/2017.   Why:  Assessment for group counseling services at 9:45AM. Please let them know that you are planning to [facility]. Please bring: photo ID, social security card, and any proof of household income if you have it. Thank you.  Contact information: [address] [place] [address] [phone]            Plan Of Care/Follow-up recommendations:  1. Continue current psychotropic medications 2. Mental health and addiction follow up as arranged.  3. Discharge in care of her family 4. Provided limited quantity of prescriptions   Georgiann CockerVincent A Izediuno, MD 01/13/2017, 9:56 AM

## 2018-01-22 IMAGING — CR DG CHEST 2V
2 series · 2 of 2 positions shown · non-contrast
Comparison: Chest radiograph performed 02/06/2016

CLINICAL DATA: Acute onset of syncope.  Initial encounter.

EXAM:
CHEST  2 VIEW

[chest lat]
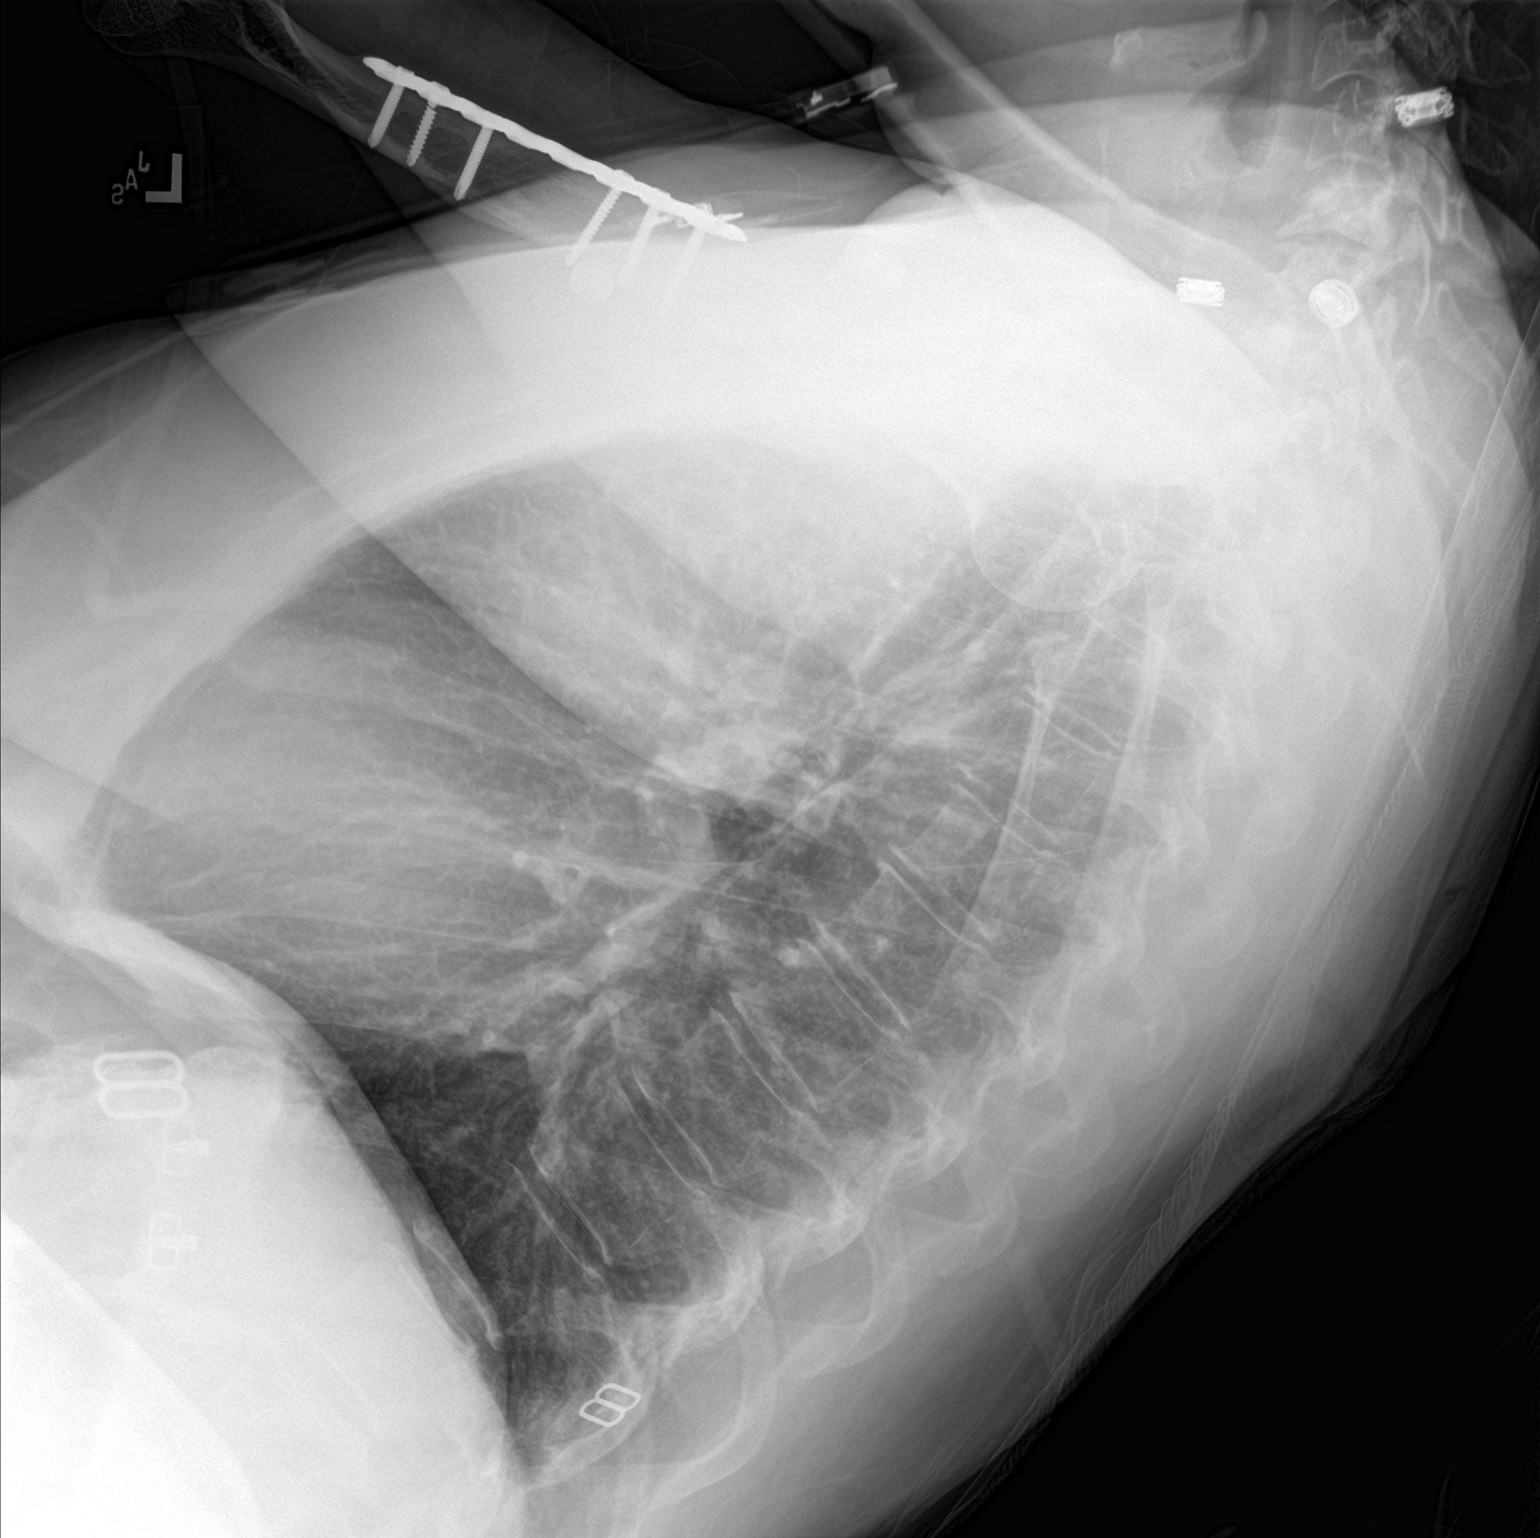

[chest ap]
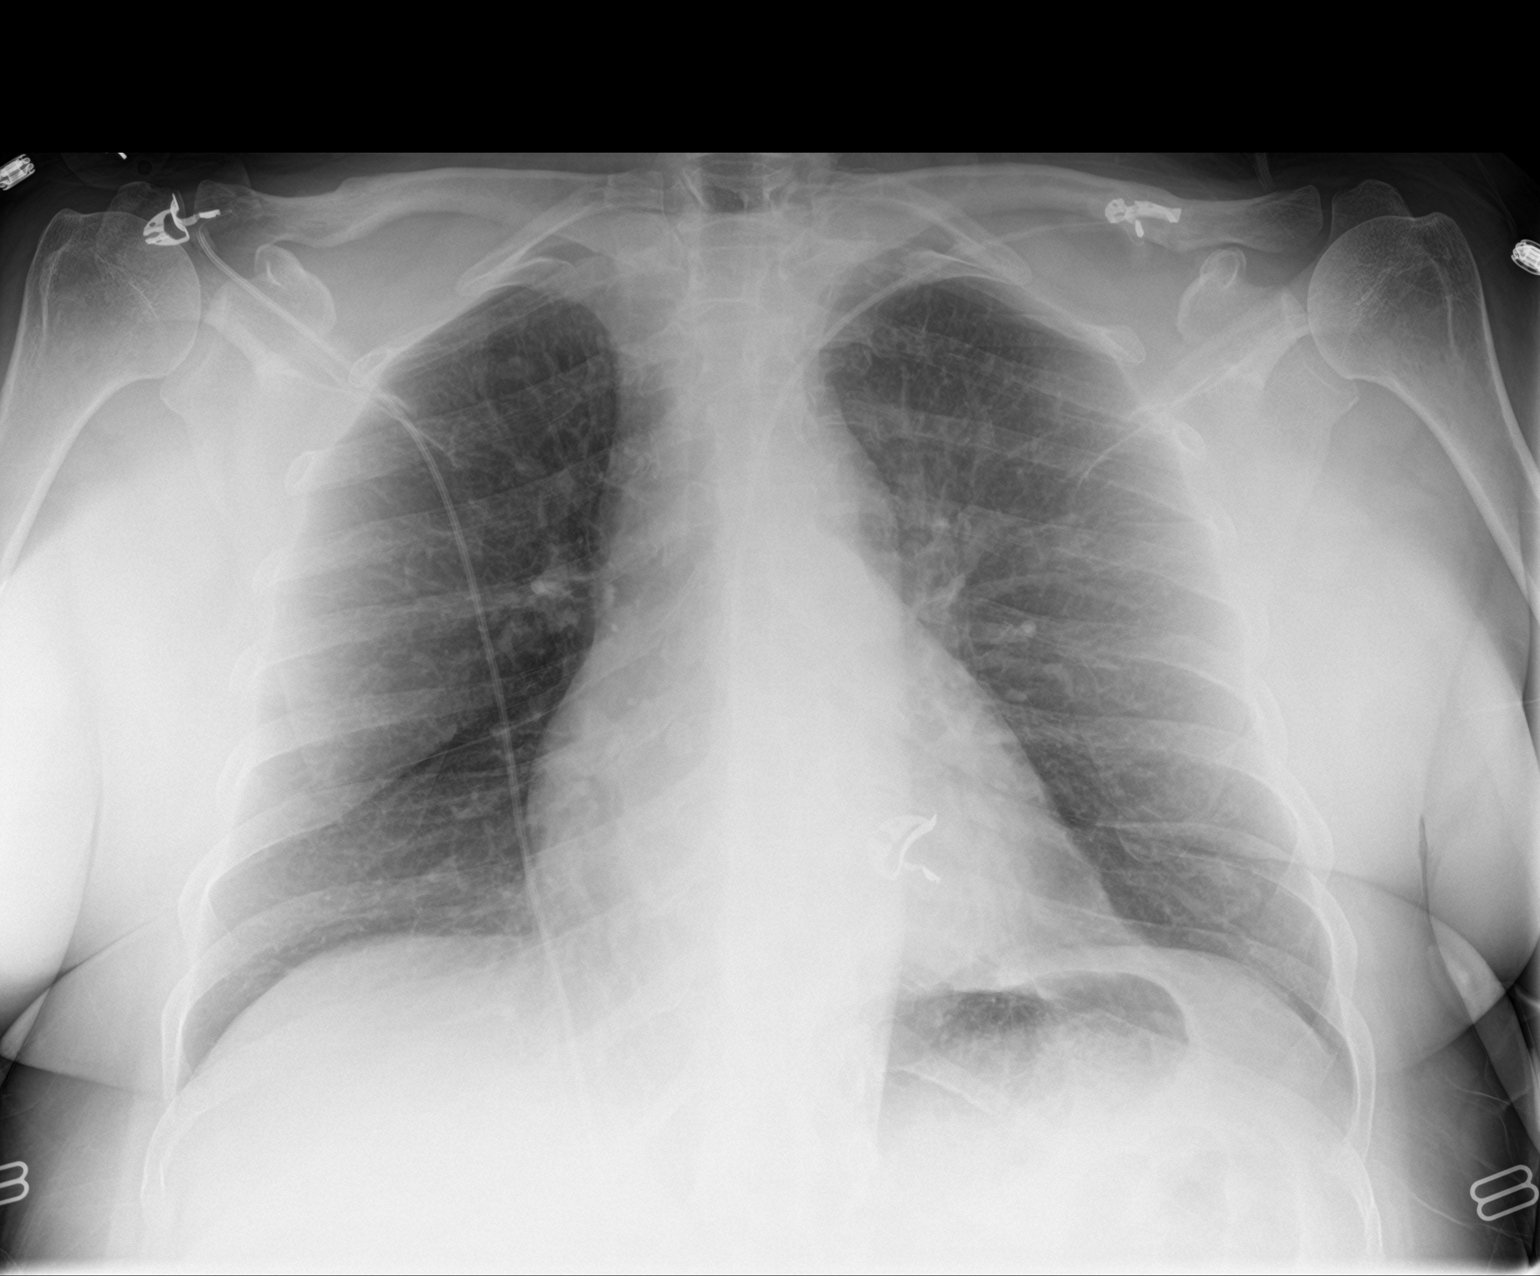

[2 of 2 positions shown; findings below may reference images not displayed]

FINDINGS: The lungs are well-aerated. Pulmonary vascularity is at the upper
limits of normal. There is no evidence of focal opacification,
pleural effusion or pneumothorax.

The heart is normal in size; the mediastinal contour is within
normal limits. No acute osseous abnormalities are seen.
IMPRESSION: No acute cardiopulmonary process seen.

## 2018-02-15 DIAGNOSIS — E876 Hypokalemia: Secondary | ICD-10-CM

## 2018-02-15 DIAGNOSIS — F419 Anxiety disorder, unspecified: Secondary | ICD-10-CM

## 2018-02-15 DIAGNOSIS — E860 Dehydration: Secondary | ICD-10-CM

## 2018-02-15 DIAGNOSIS — F10231 Alcohol dependence with withdrawal delirium: Secondary | ICD-10-CM

## 2018-02-15 DIAGNOSIS — R635 Abnormal weight gain: Secondary | ICD-10-CM

## 2018-02-15 DIAGNOSIS — F1010 Alcohol abuse, uncomplicated: Secondary | ICD-10-CM

## 2018-02-15 DIAGNOSIS — G894 Chronic pain syndrome: Secondary | ICD-10-CM

## 2018-02-15 DIAGNOSIS — Z720 Tobacco use: Secondary | ICD-10-CM

## 2018-02-18 DIAGNOSIS — R6889 Other general symptoms and signs: Secondary | ICD-10-CM

## 2018-02-18 DIAGNOSIS — G8921 Chronic pain due to trauma: Secondary | ICD-10-CM

## 2018-02-18 DIAGNOSIS — G43909 Migraine, unspecified, not intractable, without status migrainosus: Secondary | ICD-10-CM

## 2018-02-18 DIAGNOSIS — N912 Amenorrhea, unspecified: Secondary | ICD-10-CM

## 2018-02-18 DIAGNOSIS — R740 Nonspecific elevation of levels of transaminase and lactic acid dehydrogenase [LDH]: Secondary | ICD-10-CM

## 2018-02-19 DIAGNOSIS — R0602 Shortness of breath: Secondary | ICD-10-CM

## 2018-09-10 IMAGING — US US ABDOMEN LIMITED
1 series · 14 of 25 positions shown · non-contrast
Comparison: None.

CLINICAL DATA: Abdominal pain.

EXAM:
US ABDOMEN LIMITED - RIGHT UPPER QUADRANT

[Series 1: us abdomen limited · 0.26mm/px · 14 of 29 slices shown]
[im 1/29]
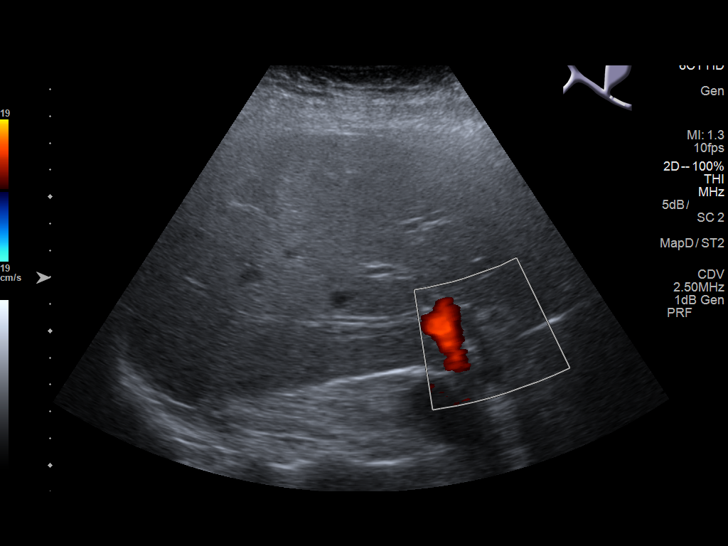
[im 3/29]
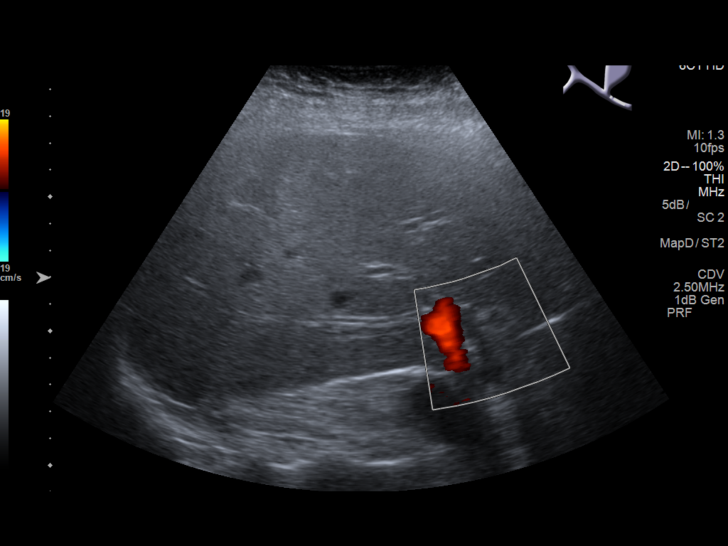
[im 5/29]
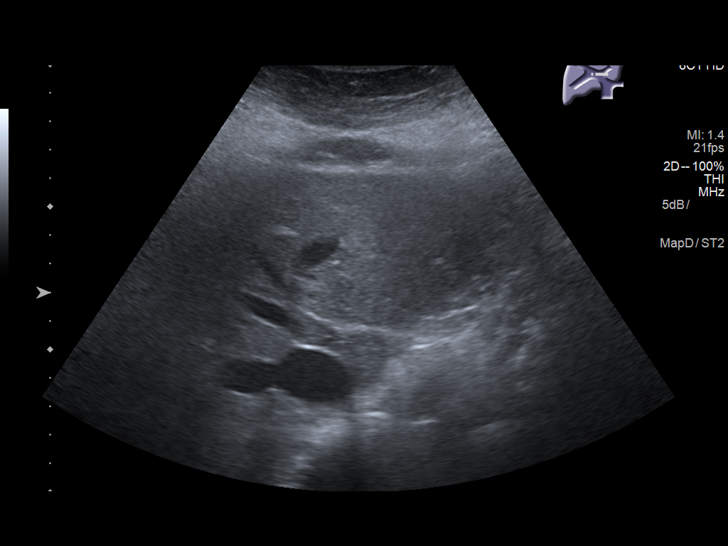
[im 8/29]
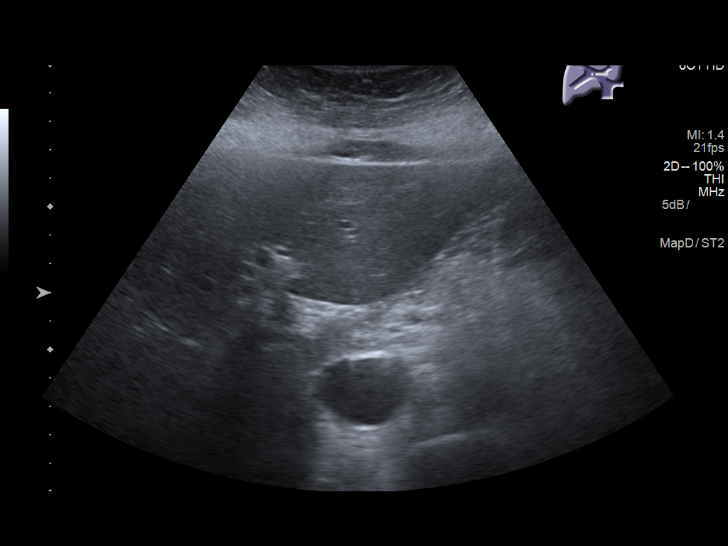
[im 10/29]
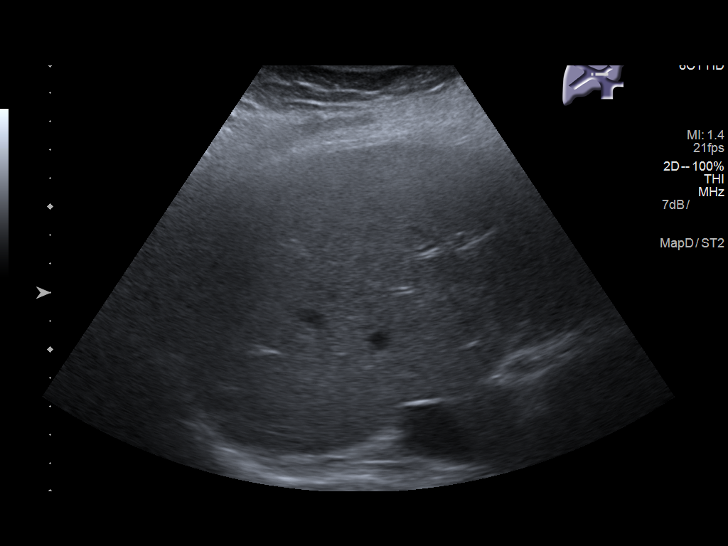
[im 11/29]
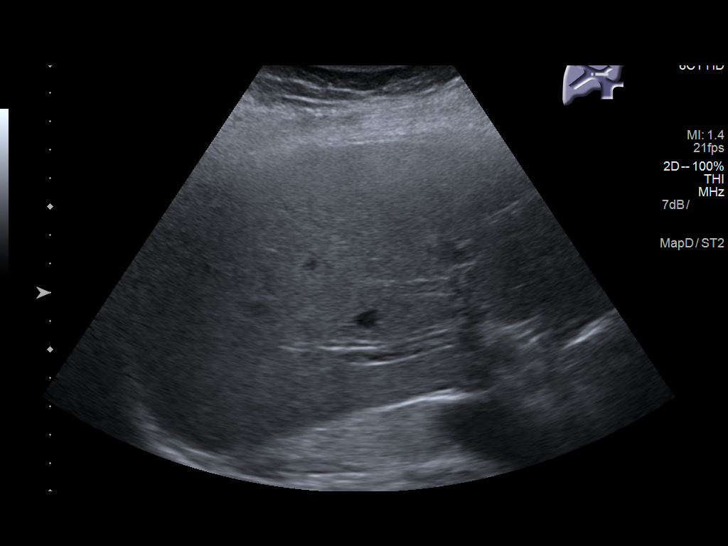
[im 13/29]
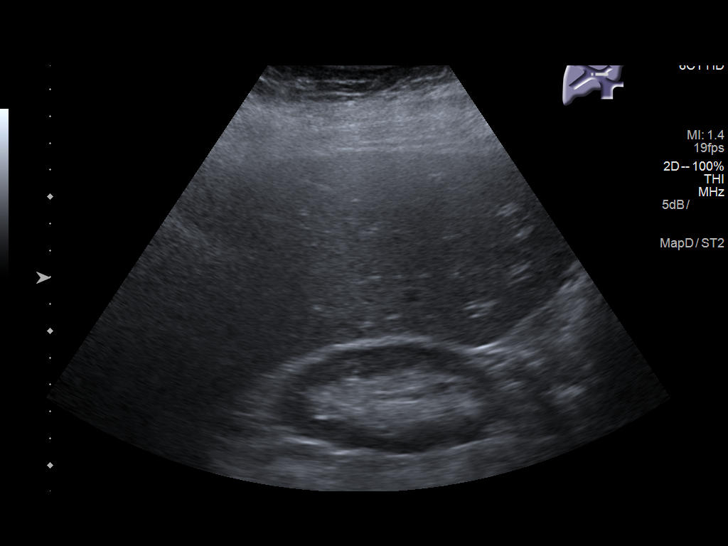
[im 16/29]
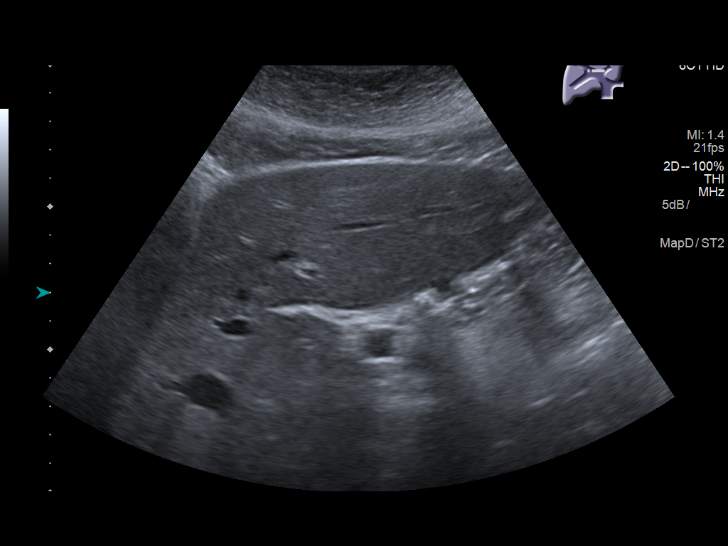
[im 18/29]
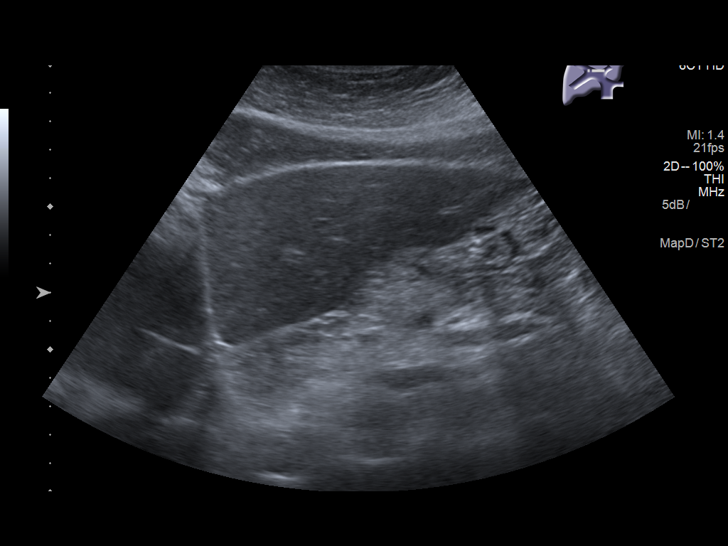
[im 19/29]
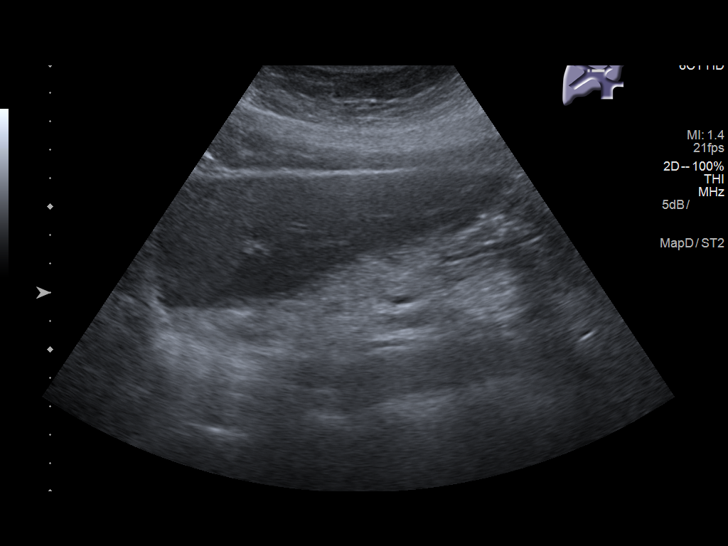
[im 22/29]
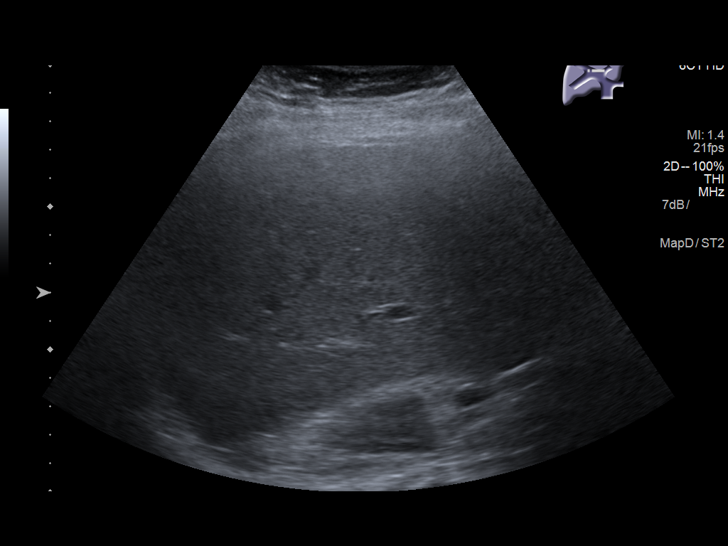
[im 24/29]
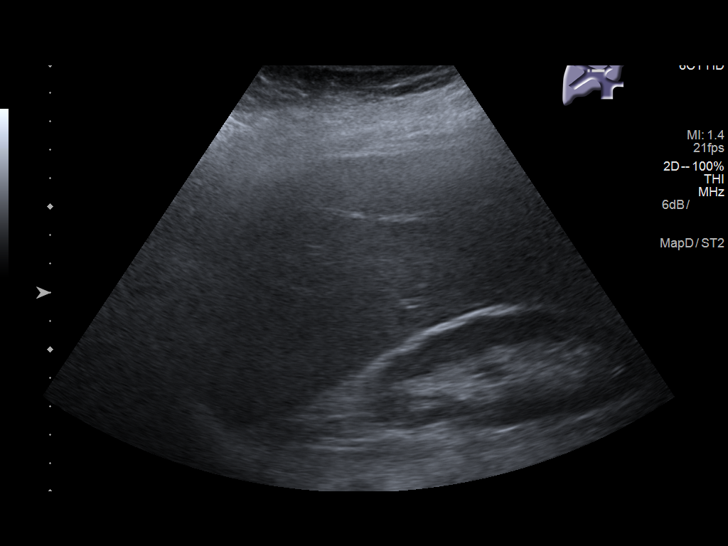
[im 26/29]
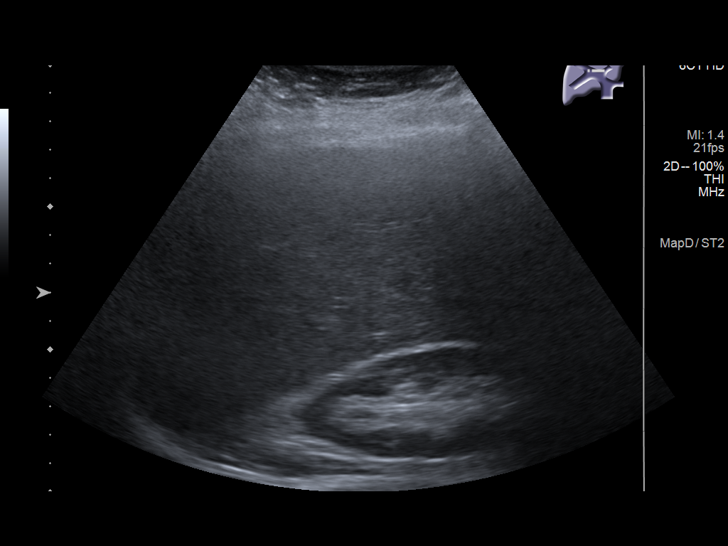
[im 29/29]
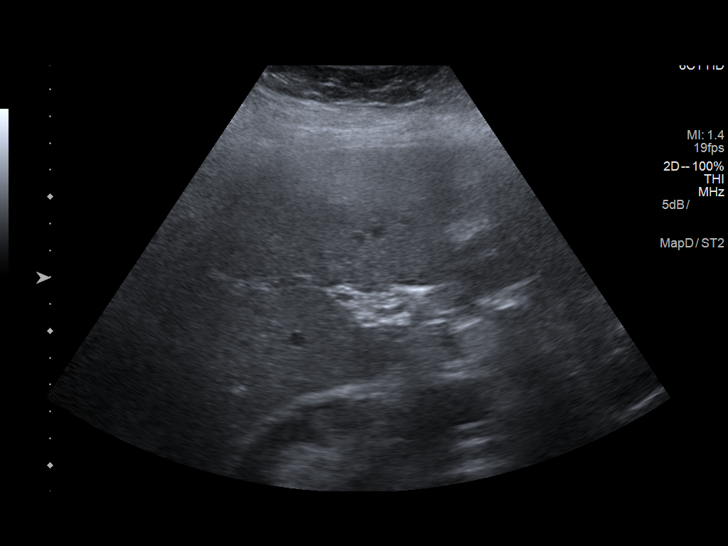

[14 of 25 positions shown; findings below may reference images not displayed]

FINDINGS: Gallbladder:

The gallbladder is not visualized. The patient reports previous
cholecystectomy.

Common bile duct:

Diameter: 5.2 mm

Liver:

Hepatic steatosis
IMPRESSION: 1. Probable hepatic steatosis.  No other abnormalities.

## 2023-10-25 DIAGNOSIS — I509 Heart failure, unspecified: Secondary | ICD-10-CM
